# Patient Record
Sex: Female | Born: 1935 | Race: White | Hispanic: No | Smoking: Former smoker
Health system: Southern US, Community
[De-identification: ages and names within clinical notes are randomized; demographics above are authoritative.]

## PROBLEM LIST (undated history)

## (undated) DIAGNOSIS — H3530 Unspecified macular degeneration: Secondary | ICD-10-CM

## (undated) DIAGNOSIS — F17201 Nicotine dependence, unspecified, in remission: Secondary | ICD-10-CM

## (undated) DIAGNOSIS — F419 Anxiety disorder, unspecified: Secondary | ICD-10-CM

## (undated) DIAGNOSIS — F410 Panic disorder [episodic paroxysmal anxiety] without agoraphobia: Secondary | ICD-10-CM

## (undated) DIAGNOSIS — I4891 Unspecified atrial fibrillation: Secondary | ICD-10-CM

## (undated) DIAGNOSIS — C55 Malignant neoplasm of uterus, part unspecified: Secondary | ICD-10-CM

## (undated) DIAGNOSIS — K635 Polyp of colon: Secondary | ICD-10-CM

## (undated) DIAGNOSIS — F32A Depression, unspecified: Secondary | ICD-10-CM

## (undated) DIAGNOSIS — K922 Gastrointestinal hemorrhage, unspecified: Secondary | ICD-10-CM

## (undated) DIAGNOSIS — Z7901 Long term (current) use of anticoagulants: Secondary | ICD-10-CM

## (undated) DIAGNOSIS — K5790 Diverticulosis of intestine, part unspecified, without perforation or abscess without bleeding: Secondary | ICD-10-CM

## (undated) DIAGNOSIS — F329 Major depressive disorder, single episode, unspecified: Secondary | ICD-10-CM

## (undated) HISTORY — PX: COLONOSCOPY: SHX174

## (undated) HISTORY — DX: Unspecified macular degeneration: H35.30

## (undated) HISTORY — DX: Anxiety disorder, unspecified: F41.9

## (undated) HISTORY — DX: Depression, unspecified: F32.A

## (undated) HISTORY — DX: Nicotine dependence, unspecified, in remission: F17.201

## (undated) HISTORY — DX: Malignant neoplasm of uterus, part unspecified: C55

## (undated) HISTORY — DX: Diverticulosis of intestine, part unspecified, without perforation or abscess without bleeding: K57.90

## (undated) HISTORY — DX: Polyp of colon: K63.5

## (undated) HISTORY — DX: Unspecified atrial fibrillation: I48.91

## (undated) HISTORY — DX: Long term (current) use of anticoagulants: Z79.01

## (undated) HISTORY — DX: Major depressive disorder, single episode, unspecified: F32.9

## (undated) HISTORY — DX: Gastrointestinal hemorrhage, unspecified: K92.2

---

## 1989-03-10 HISTORY — PX: CHOLECYSTECTOMY: SHX55

## 2000-07-10 DIAGNOSIS — C55 Malignant neoplasm of uterus, part unspecified: Secondary | ICD-10-CM

## 2000-07-10 HISTORY — DX: Malignant neoplasm of uterus, part unspecified: C55

## 2000-07-10 HISTORY — PX: TOTAL ABDOMINAL HYSTERECTOMY W/ BILATERAL SALPINGOOPHORECTOMY: SHX83

## 2000-07-10 HISTORY — PX: APPENDECTOMY: SHX54

## 2000-08-01 ENCOUNTER — Encounter: Admission: RE | Admit: 2000-08-01 | Discharge: 2000-10-30 | Payer: Self-pay | Admitting: *Deleted

## 2003-07-11 DIAGNOSIS — K635 Polyp of colon: Secondary | ICD-10-CM

## 2003-07-11 HISTORY — DX: Polyp of colon: K63.5

## 2004-04-05 ENCOUNTER — Ambulatory Visit (HOSPITAL_COMMUNITY): Admission: RE | Admit: 2004-04-05 | Discharge: 2004-04-05 | Payer: Self-pay | Admitting: *Deleted

## 2004-11-30 ENCOUNTER — Encounter (INDEPENDENT_AMBULATORY_CARE_PROVIDER_SITE_OTHER): Payer: Self-pay | Admitting: Internal Medicine

## 2005-07-10 DIAGNOSIS — I4891 Unspecified atrial fibrillation: Secondary | ICD-10-CM

## 2005-07-10 HISTORY — DX: Unspecified atrial fibrillation: I48.91

## 2005-08-23 ENCOUNTER — Ambulatory Visit (HOSPITAL_COMMUNITY): Admission: RE | Admit: 2005-08-23 | Discharge: 2005-08-23 | Payer: Self-pay | Admitting: Internal Medicine

## 2005-08-23 ENCOUNTER — Ambulatory Visit: Payer: Self-pay | Admitting: Internal Medicine

## 2005-08-23 ENCOUNTER — Encounter (INDEPENDENT_AMBULATORY_CARE_PROVIDER_SITE_OTHER): Payer: Self-pay | Admitting: Internal Medicine

## 2005-08-29 ENCOUNTER — Ambulatory Visit: Payer: Self-pay | Admitting: *Deleted

## 2005-09-06 ENCOUNTER — Ambulatory Visit: Payer: Self-pay | Admitting: Cardiology

## 2005-09-06 ENCOUNTER — Encounter (INDEPENDENT_AMBULATORY_CARE_PROVIDER_SITE_OTHER): Payer: Self-pay | Admitting: Internal Medicine

## 2005-09-06 ENCOUNTER — Ambulatory Visit (HOSPITAL_COMMUNITY): Admission: RE | Admit: 2005-09-06 | Discharge: 2005-09-06 | Payer: Self-pay | Admitting: *Deleted

## 2005-09-07 ENCOUNTER — Encounter: Payer: Self-pay | Admitting: Cardiology

## 2005-09-07 LAB — CONVERTED CEMR LAB
ALT: 12 units/L
AST: 15 units/L
Albumin: 4.5 g/dL
Alkaline Phosphatase: 91 units/L
BUN: 12 mg/dL
CO2: 23 meq/L
Chloride: 106 meq/L
Glucose, Bld: 99 mg/dL
Total Protein: 7.4 g/dL
Triglyceride fasting, serum: 70 mg/dL

## 2005-09-08 ENCOUNTER — Ambulatory Visit: Payer: Self-pay | Admitting: *Deleted

## 2005-09-08 ENCOUNTER — Encounter (INDEPENDENT_AMBULATORY_CARE_PROVIDER_SITE_OTHER): Payer: Self-pay | Admitting: Internal Medicine

## 2005-09-12 ENCOUNTER — Ambulatory Visit: Payer: Self-pay | Admitting: *Deleted

## 2005-09-12 ENCOUNTER — Encounter (INDEPENDENT_AMBULATORY_CARE_PROVIDER_SITE_OTHER): Payer: Self-pay | Admitting: Internal Medicine

## 2005-09-18 ENCOUNTER — Ambulatory Visit: Payer: Self-pay | Admitting: Internal Medicine

## 2005-09-18 ENCOUNTER — Ambulatory Visit: Payer: Self-pay | Admitting: Cardiology

## 2005-09-20 ENCOUNTER — Encounter (INDEPENDENT_AMBULATORY_CARE_PROVIDER_SITE_OTHER): Payer: Self-pay | Admitting: Internal Medicine

## 2005-09-21 ENCOUNTER — Encounter: Payer: Self-pay | Admitting: Cardiology

## 2005-09-26 ENCOUNTER — Ambulatory Visit: Payer: Self-pay | Admitting: Cardiology

## 2005-10-04 ENCOUNTER — Telehealth (INDEPENDENT_AMBULATORY_CARE_PROVIDER_SITE_OTHER): Payer: Self-pay | Admitting: Internal Medicine

## 2005-10-24 ENCOUNTER — Ambulatory Visit: Payer: Self-pay | Admitting: *Deleted

## 2005-11-16 ENCOUNTER — Encounter (INDEPENDENT_AMBULATORY_CARE_PROVIDER_SITE_OTHER): Payer: Self-pay | Admitting: Internal Medicine

## 2005-11-17 ENCOUNTER — Ambulatory Visit: Payer: Self-pay | Admitting: Internal Medicine

## 2005-11-21 ENCOUNTER — Encounter (INDEPENDENT_AMBULATORY_CARE_PROVIDER_SITE_OTHER): Payer: Self-pay | Admitting: Internal Medicine

## 2005-11-24 ENCOUNTER — Ambulatory Visit: Payer: Self-pay | Admitting: *Deleted

## 2005-12-26 ENCOUNTER — Ambulatory Visit: Payer: Self-pay | Admitting: *Deleted

## 2006-01-23 ENCOUNTER — Ambulatory Visit: Payer: Self-pay | Admitting: Cardiology

## 2006-02-02 ENCOUNTER — Encounter (INDEPENDENT_AMBULATORY_CARE_PROVIDER_SITE_OTHER): Payer: Self-pay | Admitting: Internal Medicine

## 2006-03-05 ENCOUNTER — Ambulatory Visit: Payer: Self-pay | Admitting: Cardiology

## 2006-04-05 ENCOUNTER — Ambulatory Visit: Payer: Self-pay | Admitting: Cardiology

## 2006-04-12 ENCOUNTER — Ambulatory Visit: Payer: Self-pay | Admitting: Cardiology

## 2006-04-26 ENCOUNTER — Ambulatory Visit: Payer: Self-pay | Admitting: Cardiology

## 2006-05-17 ENCOUNTER — Ambulatory Visit: Payer: Self-pay | Admitting: *Deleted

## 2006-06-05 ENCOUNTER — Ambulatory Visit: Payer: Self-pay | Admitting: Cardiovascular Disease

## 2006-06-21 ENCOUNTER — Ambulatory Visit: Payer: Self-pay | Admitting: Internal Medicine

## 2006-07-19 ENCOUNTER — Ambulatory Visit: Payer: Self-pay | Admitting: Internal Medicine

## 2006-08-28 ENCOUNTER — Ambulatory Visit: Payer: Self-pay | Admitting: Cardiology

## 2006-09-27 ENCOUNTER — Ambulatory Visit: Payer: Self-pay | Admitting: Cardiology

## 2006-10-15 ENCOUNTER — Ambulatory Visit: Payer: Self-pay | Admitting: Internal Medicine

## 2006-10-22 ENCOUNTER — Ambulatory Visit (HOSPITAL_COMMUNITY): Admission: RE | Admit: 2006-10-22 | Discharge: 2006-10-22 | Payer: Self-pay | Admitting: Internal Medicine

## 2006-10-24 ENCOUNTER — Ambulatory Visit: Payer: Self-pay | Admitting: Cardiology

## 2006-11-13 ENCOUNTER — Ambulatory Visit: Payer: Self-pay | Admitting: Cardiology

## 2006-12-12 ENCOUNTER — Ambulatory Visit: Payer: Self-pay | Admitting: Cardiology

## 2007-01-16 ENCOUNTER — Ambulatory Visit: Payer: Self-pay | Admitting: Cardiology

## 2007-01-30 ENCOUNTER — Ambulatory Visit: Payer: Self-pay | Admitting: Cardiovascular Disease

## 2007-02-27 ENCOUNTER — Ambulatory Visit: Payer: Self-pay | Admitting: Cardiovascular Disease

## 2007-03-08 ENCOUNTER — Ambulatory Visit: Payer: Self-pay | Admitting: Cardiology

## 2007-03-22 ENCOUNTER — Ambulatory Visit: Payer: Self-pay | Admitting: Cardiology

## 2007-04-19 ENCOUNTER — Ambulatory Visit: Payer: Self-pay | Admitting: Internal Medicine

## 2007-05-13 ENCOUNTER — Ambulatory Visit: Payer: Self-pay | Admitting: Cardiology

## 2007-05-29 ENCOUNTER — Ambulatory Visit: Payer: Self-pay | Admitting: Internal Medicine

## 2007-05-29 DIAGNOSIS — H3530 Unspecified macular degeneration: Secondary | ICD-10-CM | POA: Insufficient documentation

## 2007-05-30 ENCOUNTER — Encounter (INDEPENDENT_AMBULATORY_CARE_PROVIDER_SITE_OTHER): Payer: Self-pay | Admitting: Internal Medicine

## 2007-06-07 ENCOUNTER — Emergency Department (HOSPITAL_COMMUNITY): Admission: EM | Admit: 2007-06-07 | Discharge: 2007-06-07 | Payer: Self-pay | Admitting: Emergency Medicine

## 2007-06-10 ENCOUNTER — Encounter: Payer: Self-pay | Admitting: Cardiology

## 2007-06-10 ENCOUNTER — Ambulatory Visit: Payer: Self-pay | Admitting: Internal Medicine

## 2007-06-10 LAB — CONVERTED CEMR LAB: HCT: 42.7 %

## 2007-06-13 ENCOUNTER — Telehealth (INDEPENDENT_AMBULATORY_CARE_PROVIDER_SITE_OTHER): Payer: Self-pay | Admitting: *Deleted

## 2007-06-14 ENCOUNTER — Ambulatory Visit: Payer: Self-pay | Admitting: Internal Medicine

## 2007-06-14 LAB — CONVERTED CEMR LAB
Hemoglobin: 11.6 g/dL
INR: 1.3
OCCULT 1: POSITIVE

## 2007-06-17 ENCOUNTER — Ambulatory Visit: Payer: Self-pay | Admitting: Internal Medicine

## 2007-06-17 LAB — CONVERTED CEMR LAB: Prothrombin Time: 11.9 s

## 2007-06-19 ENCOUNTER — Encounter: Payer: Self-pay | Admitting: Gastroenterology

## 2007-06-19 ENCOUNTER — Ambulatory Visit (HOSPITAL_COMMUNITY): Admission: RE | Admit: 2007-06-19 | Discharge: 2007-06-19 | Payer: Self-pay | Admitting: Gastroenterology

## 2007-06-19 ENCOUNTER — Ambulatory Visit: Payer: Self-pay | Admitting: Gastroenterology

## 2007-06-20 ENCOUNTER — Telehealth (INDEPENDENT_AMBULATORY_CARE_PROVIDER_SITE_OTHER): Payer: Self-pay | Admitting: Internal Medicine

## 2007-06-20 ENCOUNTER — Ambulatory Visit: Payer: Self-pay | Admitting: Internal Medicine

## 2007-06-26 ENCOUNTER — Ambulatory Visit: Payer: Self-pay | Admitting: Internal Medicine

## 2007-07-02 ENCOUNTER — Ambulatory Visit: Payer: Self-pay | Admitting: Cardiology

## 2007-07-09 ENCOUNTER — Ambulatory Visit: Payer: Self-pay | Admitting: Cardiology

## 2007-07-17 ENCOUNTER — Ambulatory Visit: Payer: Self-pay | Admitting: Cardiology

## 2007-08-02 ENCOUNTER — Ambulatory Visit: Payer: Self-pay | Admitting: Cardiology

## 2007-09-02 ENCOUNTER — Ambulatory Visit: Payer: Self-pay | Admitting: Cardiology

## 2007-09-30 ENCOUNTER — Ambulatory Visit: Payer: Self-pay | Admitting: Internal Medicine

## 2007-10-04 ENCOUNTER — Ambulatory Visit: Payer: Self-pay | Admitting: Cardiology

## 2007-10-11 ENCOUNTER — Ambulatory Visit: Payer: Self-pay | Admitting: Cardiology

## 2007-11-18 ENCOUNTER — Ambulatory Visit: Payer: Self-pay | Admitting: Cardiology

## 2007-12-03 ENCOUNTER — Ambulatory Visit: Payer: Self-pay | Admitting: Cardiology

## 2007-12-24 ENCOUNTER — Ambulatory Visit: Payer: Self-pay | Admitting: Cardiology

## 2008-01-21 ENCOUNTER — Ambulatory Visit: Payer: Self-pay | Admitting: Cardiology

## 2008-02-25 ENCOUNTER — Ambulatory Visit: Payer: Self-pay | Admitting: Cardiology

## 2008-03-09 ENCOUNTER — Ambulatory Visit: Payer: Self-pay | Admitting: Cardiology

## 2008-04-06 ENCOUNTER — Ambulatory Visit: Payer: Self-pay | Admitting: Cardiology

## 2008-04-27 ENCOUNTER — Ambulatory Visit: Payer: Self-pay | Admitting: Cardiology

## 2008-05-11 ENCOUNTER — Ambulatory Visit: Payer: Self-pay | Admitting: Cardiology

## 2008-06-01 ENCOUNTER — Ambulatory Visit: Payer: Self-pay | Admitting: Cardiology

## 2008-06-08 ENCOUNTER — Ambulatory Visit: Payer: Self-pay | Admitting: Cardiology

## 2008-07-06 ENCOUNTER — Ambulatory Visit: Payer: Self-pay | Admitting: Cardiology

## 2008-08-03 ENCOUNTER — Ambulatory Visit: Payer: Self-pay | Admitting: Cardiology

## 2008-08-31 ENCOUNTER — Ambulatory Visit: Payer: Self-pay | Admitting: Cardiology

## 2008-10-12 ENCOUNTER — Ambulatory Visit: Payer: Self-pay | Admitting: Cardiology

## 2008-11-19 ENCOUNTER — Ambulatory Visit: Payer: Self-pay | Admitting: Cardiology

## 2008-12-14 ENCOUNTER — Ambulatory Visit: Payer: Self-pay | Admitting: Cardiology

## 2008-12-21 ENCOUNTER — Ambulatory Visit: Payer: Self-pay | Admitting: Cardiology

## 2009-01-07 ENCOUNTER — Ambulatory Visit: Payer: Self-pay | Admitting: Cardiology

## 2009-02-08 ENCOUNTER — Ambulatory Visit: Payer: Self-pay | Admitting: Cardiology

## 2009-02-22 ENCOUNTER — Ambulatory Visit: Payer: Self-pay | Admitting: Cardiology

## 2009-02-22 ENCOUNTER — Encounter: Payer: Self-pay | Admitting: *Deleted

## 2009-03-18 ENCOUNTER — Ambulatory Visit: Payer: Self-pay

## 2009-03-18 LAB — CONVERTED CEMR LAB: POC INR: 1.8

## 2009-04-07 ENCOUNTER — Ambulatory Visit: Payer: Self-pay | Admitting: Cardiology

## 2009-04-07 LAB — CONVERTED CEMR LAB: POC INR: 4.6

## 2009-04-21 ENCOUNTER — Ambulatory Visit: Payer: Self-pay | Admitting: Cardiology

## 2009-04-21 LAB — CONVERTED CEMR LAB: POC INR: 2

## 2009-05-27 ENCOUNTER — Encounter (INDEPENDENT_AMBULATORY_CARE_PROVIDER_SITE_OTHER): Payer: Self-pay | Admitting: Cardiology

## 2009-05-31 ENCOUNTER — Ambulatory Visit: Payer: Self-pay | Admitting: Cardiology

## 2009-07-12 ENCOUNTER — Ambulatory Visit: Payer: Self-pay | Admitting: Cardiology

## 2009-07-28 ENCOUNTER — Encounter (INDEPENDENT_AMBULATORY_CARE_PROVIDER_SITE_OTHER): Payer: Self-pay | Admitting: Cardiology

## 2009-08-11 ENCOUNTER — Telehealth: Payer: Self-pay | Admitting: Cardiology

## 2009-08-12 ENCOUNTER — Telehealth (INDEPENDENT_AMBULATORY_CARE_PROVIDER_SITE_OTHER): Payer: Self-pay | Admitting: *Deleted

## 2009-08-18 ENCOUNTER — Ambulatory Visit: Payer: Self-pay | Admitting: Cardiology

## 2009-08-18 LAB — CONVERTED CEMR LAB: POC INR: 1

## 2009-08-25 ENCOUNTER — Ambulatory Visit: Payer: Self-pay | Admitting: Cardiovascular Disease

## 2009-08-25 LAB — CONVERTED CEMR LAB: POC INR: 1.6

## 2009-09-01 ENCOUNTER — Ambulatory Visit: Payer: Self-pay | Admitting: Cardiology

## 2009-09-01 LAB — CONVERTED CEMR LAB: POC INR: 2.7

## 2009-09-15 ENCOUNTER — Encounter (INDEPENDENT_AMBULATORY_CARE_PROVIDER_SITE_OTHER): Payer: Self-pay | Admitting: *Deleted

## 2009-09-20 ENCOUNTER — Ambulatory Visit: Payer: Self-pay | Admitting: Cardiology

## 2009-09-20 LAB — CONVERTED CEMR LAB: POC INR: 2.7

## 2009-09-29 ENCOUNTER — Telehealth: Payer: Self-pay | Admitting: Cardiology

## 2009-10-13 ENCOUNTER — Ambulatory Visit: Payer: Self-pay | Admitting: Cardiology

## 2009-10-13 LAB — CONVERTED CEMR LAB: POC INR: 3

## 2009-11-04 ENCOUNTER — Ambulatory Visit: Payer: Self-pay | Admitting: Cardiology

## 2009-12-02 ENCOUNTER — Ambulatory Visit: Payer: Self-pay | Admitting: Cardiology

## 2009-12-02 LAB — CONVERTED CEMR LAB: POC INR: 1.4

## 2009-12-13 ENCOUNTER — Ambulatory Visit: Payer: Self-pay | Admitting: Cardiology

## 2010-01-03 ENCOUNTER — Ambulatory Visit: Payer: Self-pay | Admitting: Cardiology

## 2010-01-03 LAB — CONVERTED CEMR LAB: POC INR: 2.9

## 2010-01-31 ENCOUNTER — Ambulatory Visit: Payer: Self-pay | Admitting: Cardiology

## 2010-02-21 ENCOUNTER — Ambulatory Visit: Payer: Self-pay | Admitting: Cardiology

## 2010-02-21 LAB — CONVERTED CEMR LAB: POC INR: 2.8

## 2010-03-01 ENCOUNTER — Ambulatory Visit: Payer: Self-pay | Admitting: Cardiology

## 2010-03-01 ENCOUNTER — Encounter: Payer: Self-pay | Admitting: Adult Health

## 2010-03-09 ENCOUNTER — Telehealth (INDEPENDENT_AMBULATORY_CARE_PROVIDER_SITE_OTHER): Payer: Self-pay | Admitting: *Deleted

## 2010-03-10 ENCOUNTER — Ambulatory Visit: Payer: Self-pay | Admitting: Cardiovascular Disease

## 2010-03-10 ENCOUNTER — Ambulatory Visit (HOSPITAL_COMMUNITY): Admission: RE | Admit: 2010-03-10 | Discharge: 2010-03-10 | Payer: Self-pay | Admitting: Cardiology

## 2010-03-11 ENCOUNTER — Ambulatory Visit: Payer: Self-pay | Admitting: Cardiology

## 2010-03-11 DIAGNOSIS — F068 Other specified mental disorders due to known physiological condition: Secondary | ICD-10-CM

## 2010-03-11 LAB — CONVERTED CEMR LAB
ALT: 12 units/L (ref 0–35)
Alkaline Phosphatase: 74 units/L (ref 39–117)
BUN: 15 mg/dL (ref 6–23)
Cholesterol: 161 mg/dL (ref 0–200)
Eosinophils Absolute: 0.1 10*3/uL (ref 0.0–0.7)
Eosinophils Relative: 2 % (ref 0–5)
Glucose, Bld: 90 mg/dL (ref 70–99)
HDL: 67 mg/dL (ref >39)
Hemoglobin: 15.6 g/dL — ABNORMAL HIGH (ref 12.0–15.0)
LDL Cholesterol: 80 mg/dL (ref 0–99)
Lymphocytes Relative: 22 % (ref 12–46)
Lymphs Abs: 1.2 10*3/uL (ref 0.7–4.0)
MCV: 91.1 fL (ref 78.0–100.0)
Monocytes Absolute: 0.4 10*3/uL (ref 0.1–1.0)
Neutrophils Relative %: 69 % (ref 43–77)
Platelets: 268 10*3/uL (ref 150–400)
RBC: 5.16 M/uL — ABNORMAL HIGH (ref 3.87–5.11)
RDW: 13.8 % (ref 11.5–15.5)
Total Bilirubin: 1.3 mg/dL — ABNORMAL HIGH (ref 0.3–1.2)
Total Protein: 6.6 g/dL (ref 6.0–8.3)
WBC: 5.4 10*3/uL (ref 4.0–10.5)

## 2010-03-15 ENCOUNTER — Encounter: Payer: Self-pay | Admitting: Cardiology

## 2010-03-16 ENCOUNTER — Ambulatory Visit: Payer: Self-pay | Admitting: Cardiology

## 2010-03-16 LAB — CONVERTED CEMR LAB: POC INR: 1.3

## 2010-03-17 ENCOUNTER — Encounter (INDEPENDENT_AMBULATORY_CARE_PROVIDER_SITE_OTHER): Payer: Self-pay | Admitting: *Deleted

## 2010-03-17 LAB — CONVERTED CEMR LAB
OCCULT 1: NEGATIVE
OCCULT 2: NEGATIVE

## 2010-03-18 ENCOUNTER — Ambulatory Visit: Payer: Self-pay | Admitting: Cardiology

## 2010-03-18 ENCOUNTER — Encounter (INDEPENDENT_AMBULATORY_CARE_PROVIDER_SITE_OTHER): Payer: Self-pay | Admitting: *Deleted

## 2010-03-18 ENCOUNTER — Encounter (INDEPENDENT_AMBULATORY_CARE_PROVIDER_SITE_OTHER): Payer: Self-pay

## 2010-03-24 ENCOUNTER — Ambulatory Visit: Payer: Self-pay | Admitting: Cardiology

## 2010-03-24 LAB — CONVERTED CEMR LAB: POC INR: 1.3

## 2010-03-31 ENCOUNTER — Ambulatory Visit: Payer: Self-pay | Admitting: Cardiology

## 2010-03-31 ENCOUNTER — Encounter (INDEPENDENT_AMBULATORY_CARE_PROVIDER_SITE_OTHER): Payer: Self-pay | Admitting: *Deleted

## 2010-03-31 LAB — CONVERTED CEMR LAB: POC INR: 4.7

## 2010-04-07 ENCOUNTER — Ambulatory Visit: Payer: Self-pay | Admitting: Cardiology

## 2010-04-14 ENCOUNTER — Ambulatory Visit: Payer: Self-pay | Admitting: Cardiology

## 2010-04-21 ENCOUNTER — Ambulatory Visit: Payer: Self-pay | Admitting: Cardiology

## 2010-04-27 ENCOUNTER — Ambulatory Visit: Payer: Self-pay | Admitting: Cardiology

## 2010-04-27 LAB — CONVERTED CEMR LAB: POC INR: 3

## 2010-05-04 ENCOUNTER — Ambulatory Visit: Payer: Self-pay | Admitting: Cardiology

## 2010-05-19 ENCOUNTER — Ambulatory Visit: Payer: Self-pay | Admitting: Cardiology

## 2010-05-19 LAB — CONVERTED CEMR LAB: POC INR: 2.2

## 2010-06-09 ENCOUNTER — Ambulatory Visit: Payer: Self-pay | Admitting: Cardiology

## 2010-07-07 ENCOUNTER — Ambulatory Visit: Payer: Self-pay | Admitting: Cardiology

## 2010-07-07 LAB — CONVERTED CEMR LAB: POC INR: 3.2

## 2010-07-31 ENCOUNTER — Encounter: Payer: Self-pay | Admitting: Internal Medicine

## 2010-07-31 ENCOUNTER — Encounter: Payer: Self-pay | Admitting: Cardiology

## 2010-08-04 ENCOUNTER — Ambulatory Visit: Admission: RE | Admit: 2010-08-04 | Discharge: 2010-08-04 | Payer: Self-pay | Source: Home / Self Care

## 2010-08-04 LAB — CONVERTED CEMR LAB: POC INR: 4.6

## 2010-08-11 NOTE — Medication Information (Signed)
Summary: ccr-lr  Anticoagulant Therapy  Managed by: Vashti Hey, RN Supervising MD: Diona Browner MD, Remi Deter Indication 1: Atrial Fibrillation Lab Used: [facility] [place] Site: [place] INR POC 1.4  Dietary changes: no    Health status changes: no    Bleeding/hemorrhagic complications: no    Recent/future hospitalizations: no    Any changes in medication regimen? no    Recent/future dental: no  Any missed doses?: yes     Details: not sure if she missed or not  Is patient compliant with meds? yes       Allergies: No Known Drug Allergies  Anticoagulation Management History:      The patient is taking warfarin and comes in today for a routine follow up visit.  Positive risk factors for bleeding include an age of 75 years or older and history of GI bleeding.  The bleeding index is 'intermediate risk'.  Positive CHADS2 values include Age > 64 years old.  The start date was 09/12/2005.  Her last INR was .9.  Anticoagulation responsible provider: Diona Browner MD, Remi Deter.  INR POC: 1.4.  Cuvette Lot#: [id number].  Exp: 10/11.    Anticoagulation Management Assessment/Plan:      The patient's current anticoagulation dose is Warfarin sodium 2.5 mg tabs: Take as directed by anticoagulation clinic.  The target INR is 2.0-3.0.  The next INR is due 12/13/2009.  Anticoagulation instructions were given to patient.  Results were reviewed/authorized by Vashti Hey, RN.  She was notified by Vashti Hey RN.         Prior Anticoagulation Instructions: INR 2.9 Continue coumadin 2.5mg  once daily except 3.75mg  on Fridays Pt continues to use pill box.  Current Anticoagulation Instructions: INR 1.4 Take coumadin 2 tablets tonight and tomorrow night then resume 1 tablet once daily except 1 1/2 tablets on Fridays

## 2010-08-11 NOTE — Medication Information (Signed)
Summary: ccr-lr  Anticoagulant Therapy  Managed by: Vashti Hey, RN PCP: none Supervising MD: Dietrich Pates MD, Molly Maduro Indication 1: Atrial Fibrillation Lab Used: [facility] [place] Site: [place] INR POC 2.6  Dietary changes: no    Health status changes: no    Bleeding/hemorrhagic complications: no    Recent/future hospitalizations: no    Any changes in medication regimen? no    Recent/future dental: no  Any missed doses?: no       Is patient compliant with meds? yes       Allergies: No Known Drug Allergies  Anticoagulation Management History:      The patient is taking warfarin and comes in today for a routine follow up visit.  Positive risk factors for bleeding include an age of 73 years or older and history of GI bleeding.  The bleeding index is 'intermediate risk'.  Positive CHADS2 values include Age > 34 years old.  The start date was 09/12/2005.  Her last INR was .9.  Anticoagulation responsible provider: Dietrich Pates MD, Molly Maduro.  INR POC: 2.6.  Cuvette Lot#: [id number].  Exp: 10/11.    Anticoagulation Management Assessment/Plan:      The patient's current anticoagulation dose is Warfarin sodium 2.5 mg tabs: Take as directed by anticoagulation clinic.  The target INR is 2.0-3.0.  The next INR is due 04/21/2010.  Anticoagulation instructions were given to patient.  Results were reviewed/authorized by Vashti Hey, RN.  She was notified by Vashti Hey RN.         Prior Anticoagulation Instructions: INR 2.9 Today is Thursday. Take 1 tablet every night until you come back next Thursday 04/14/10  Current Anticoagulation Instructions: INR 2.6 Continue coumadin 1 green tablet until you come back next Thursday

## 2010-08-11 NOTE — Medication Information (Signed)
Summary: PROTIME/TG  Anticoagulant Therapy  Managed by: Vashti Hey, RN Supervising MD: Dietrich Pates MD, Molly Maduro Indication 1: Atrial Fibrillation Lab Used: [facility] [place] Site: [place] INR POC 2.7  Dietary changes: no    Health status changes: no    Bleeding/hemorrhagic complications: no    Recent/future hospitalizations: no    Any changes in medication regimen? no    Recent/future dental: no  Any missed doses?: no       Is patient compliant with meds? yes       Allergies: No Known Drug Allergies  Anticoagulation Management History:      The patient is taking warfarin and comes in today for a routine follow up visit.  Positive risk factors for bleeding include an age of 75 years or older and history of GI bleeding.  The bleeding index is 'intermediate risk'.  Positive CHADS2 values include Age > 75 years old.  The start date was 09/12/2005.  Her last INR was .9.  Anticoagulation responsible provider: Dietrich Pates MD, Molly Maduro.  INR POC: 2.7.  Cuvette Lot#: [id number].  Exp: 10/11.    Anticoagulation Management Assessment/Plan:      The patient's current anticoagulation dose is Warfarin sodium 2.5 mg tabs: take one half tablet daily as directed by anticoagulation clinic.  The target INR is 2.0-3.0.  The next INR is due 10/06/2009.  Anticoagulation instructions were given to patient.  Results were reviewed/authorized by Vashti Hey, RN.  She was notified by Vashti Hey RN.         Prior Anticoagulation Instructions: INR 2.7 Continue coumadin 1 tablet once daily except 1 1/2 tablets on Fridays  Current Anticoagulation Instructions: INR 2.7 Continue coumadin 2.5mg  once daily except 3.75mg  on Fridays

## 2010-08-11 NOTE — Medication Information (Signed)
Summary: ccr-lr  Anticoagulant Therapy  Managed by: Vashti Hey, RN PCP: none Supervising MD: Dietrich Pates MD, Molly Maduro Indication 1: Atrial Fibrillation Lab Used: [facility] [place] Site: [place] INR POC 4.0  Dietary changes: no    Health status changes: no    Bleeding/hemorrhagic complications: no    Recent/future hospitalizations: no    Any changes in medication regimen? no    Recent/future dental: no  Any missed doses?: no       Is patient compliant with meds? yes       Allergies: No Known Drug Allergies  Anticoagulation Management History:      The patient is taking warfarin and comes in today for a routine follow up visit.  Positive risk factors for bleeding include an age of 75 years or older and history of GI bleeding.  The bleeding index is 'intermediate risk'.  Positive CHADS2 values include Age > 75 years old.  The start date was 09/12/2005.  Her last INR was .9.  Anticoagulation responsible provider: Dietrich Pates MD, Molly Maduro.  INR POC: 4.0.  Cuvette Lot#: [id number].  Exp: 10/11.    Anticoagulation Management Assessment/Plan:      The patient's current anticoagulation dose is Warfarin sodium 2.5 mg tabs: Take as directed by anticoagulation clinic.  The target INR is 2.0-3.0.  The next INR is due 04/27/2010.  Anticoagulation instructions were given to patient.  Results were reviewed/authorized by Vashti Hey, RN.  She was notified by Vashti Hey RN.         Prior Anticoagulation Instructions: INR 2.6 Continue coumadin 1 green tablet until you come back next Thursday  Current Anticoagulation Instructions: INR 4.0 Hold coumadin tonight, take 1/2 tablet tomorrow night then resume 1 tablet once daily

## 2010-08-11 NOTE — Medication Information (Signed)
Summary: ccr-lr  Anticoagulant Therapy  Managed by: Vashti Hey, RN PCP: none Supervising MD: Dietrich Pates MD, Molly Maduro Indication 1: Atrial Fibrillation Lab Used: [facility]  Site: [place] INR POC 1.3  Dietary changes: no    Health status changes: no    Bleeding/hemorrhagic complications: no    Recent/future hospitalizations: no    Any changes in medication regimen? no    Recent/future dental: no  Any missed doses?: yes     Details: Forgot some doses she thinks  Is patient compliant with meds? no     Details: Is becoming more forgetful.  Told pt pt bring pill box and meds next OV and I would prefill for her.   Allergies: No Known Drug Allergies  Anticoagulation Management History:      The patient is taking warfarin and comes in today for a routine follow up visit.  Positive risk factors for bleeding include an age of 70 years or older and history of GI bleeding.  The bleeding index is 'intermediate risk'.  Positive CHADS2 values include Age > 57 years old.  The start date was 09/12/2005.  Her last INR was .9.  Anticoagulation responsible provider: Dietrich Pates MD, Molly Maduro.  INR POC: 1.3.  Cuvette Lot#: [id number].  Exp: 10/11.    Anticoagulation Management Assessment/Plan:      The patient's current anticoagulation dose is Warfarin sodium 2.5 mg tabs: Take as directed by anticoagulation clinic.  The target INR is 2.0-3.0.  The next INR is due 03/24/2010.  Anticoagulation instructions were given to patient.  Results were reviewed/authorized by Vashti Hey, RN.  She was notified by Vashti Hey RN.         Prior Anticoagulation Instructions: INR 2.8 Continue coumadin 2.5mg  once daily except 3.75mg  on Fridays  Current Anticoagulation Instructions: INR 1.3 Take coumadin 2 tablets tonight, 1 1/2 tablets tomorrow night then resume 1 tablet once daily except 1 1/2 tablets on Fridays Pt is becoming more forgetful.  Sister accompanied pt today.

## 2010-08-11 NOTE — Medication Information (Signed)
Summary: protime/tg  Anticoagulant Therapy  Managed by: Vashti Hey, RN Supervising MD: Diona Browner MD, Remi Deter Indication 1: Atrial Fibrillation Lab Used: [facility] [place] Site: [place] INR POC 1.1  Dietary changes: no    Health status changes: no    Bleeding/hemorrhagic complications: no    Recent/future hospitalizations: no    Any changes in medication regimen? no    Recent/future dental: no  Any missed doses?: yes     Details: pt denies missing dose but does get forgetful    Recommended using pill box  Is patient compliant with meds? yes       Allergies: No Known Drug Allergies  Anticoagulation Management History:      The patient is taking warfarin and comes in today for a routine follow up visit.  Positive risk factors for bleeding include an age of 40 years or older and history of GI bleeding.  The bleeding index is 'intermediate risk'.  Positive CHADS2 values include Age > 28 years old.  The start date was 09/12/2005.  Her last INR was .9.  Anticoagulation responsible provider: Diona Browner MD, Remi Deter.  INR POC: 1.1.  Cuvette Lot#: [id number].  Exp: 10/11.    Anticoagulation Management Assessment/Plan:      The patient's current anticoagulation dose is Warfarin sodium 5 mg  tabs: 1/2 by mouth once daily.  The target INR is 2.0-3.0.  The next INR is due 07/21/2009.  Anticoagulation instructions were given to patient.  Results were reviewed/authorized by Vashti Hey, RN.  She was notified by Vashti Hey RN.         Prior Anticoagulation Instructions: INR 3.4 Hold coumadin tonight then resume 2.5mg  once daily except 3.75mg  on Fridays  Current Anticoagulation Instructions: INR 1.1 Take coumadin 2 tablets tonight and tomorrow night then resume 1 tablet once daily except 1 1/2 tablets on Fridays

## 2010-08-11 NOTE — Medication Information (Signed)
Summary: ccr-lr  Anticoagulant Therapy  Managed by: Vashti Hey, RN PCP: none Supervising MD: Dietrich Pates MD, Molly Maduro Indication 1: Atrial Fibrillation Lab Used: [facility] [place] Site: [place] INR POC 1.3  Dietary changes: no    Health status changes: yes       Details: increasing signs of dementia  Bleeding/hemorrhagic complications: no    Recent/future hospitalizations: no    Any changes in medication regimen? no    Recent/future dental: no  Any missed doses?: yes     Details: pt not sure  Is patient compliant with meds? yes       Allergies: No Known Drug Allergies  Anticoagulation Management History:      The patient is taking warfarin and comes in today for a routine follow up visit.  Positive risk factors for bleeding include an age of 75 years or older and history of GI bleeding.  The bleeding index is 'intermediate risk'.  Positive CHADS2 values include Age > 26 years old.  The start date was 09/12/2005.  Her last INR was .9.  Anticoagulation responsible provider: Dietrich Pates MD, Molly Maduro.  INR POC: 1.3.  Cuvette Lot#: [id number].  Exp: 10/11.    Anticoagulation Management Assessment/Plan:      The patient's current anticoagulation dose is Warfarin sodium 2.5 mg tabs: Take as directed by anticoagulation clinic.  The target INR is 2.0-3.0.  The next INR is due 03/31/2010.  Anticoagulation instructions were given to patient.  Results were reviewed/authorized by Vashti Hey, RN.  She was notified by Vashti Hey RN.         Prior Anticoagulation Instructions: INR 1.3 Take coumadin 2 tablets tonight, 1 1/2 tablets tomorrow night then resume 1 tablet once daily except 1 1/2 tablets on Fridays Pt is becoming more forgetful.  Sister accompanied pt today.   Current Anticoagulation Instructions: INR 1.3 Take coumadin 2 tablets tonight and tomorrow night then resume 1 tablet once daily except 1 1/2 tablets on Fridays Pill Box filled for pt.

## 2010-08-11 NOTE — Medication Information (Signed)
Summary: ccr-lr  Anticoagulant Therapy  Managed by: Vashti Hey, RN Supervising MD: Daleen Squibb MD, Maisie Fus Indication 1: Atrial Fibrillation Lab Used: [facility] [place] Site: [place] INR POC 2.8  Dietary changes: no    Health status changes: no    Bleeding/hemorrhagic complications: no    Recent/future hospitalizations: no    Any changes in medication regimen? no    Recent/future dental: no  Any missed doses?: no       Is patient compliant with meds? yes       Allergies: No Known Drug Allergies  Anticoagulation Management History:      The patient is taking warfarin and comes in today for a routine follow up visit.  Positive risk factors for bleeding include an age of 50 years or older and history of GI bleeding.  The bleeding index is 'intermediate risk'.  Positive CHADS2 values include Age > 33 years old.  The start date was 09/12/2005.  Her last INR was .9.  Anticoagulation responsible provider: Daleen Squibb MD, Maisie Fus.  INR POC: 2.8.  Cuvette Lot#: [id number].  Exp: 10/11.    Anticoagulation Management Assessment/Plan:      The patient's current anticoagulation dose is Warfarin sodium 2.5 mg tabs: Take as directed by anticoagulation clinic.  The target INR is 2.0-3.0.  The next INR is due 03/16/2010.  Anticoagulation instructions were given to patient.  Results were reviewed/authorized by Vashti Hey, RN.  She was notified by Vashti Hey RN.         Prior Anticoagulation Instructions: INR 1.8 Take coumadin 2 tablets tonight then resume 1 tablet once daily except 1 1/2 tablets on Fridays  Current Anticoagulation Instructions: INR 2.8 Continue coumadin 2.5mg  once daily except 3.75mg  on Fridays

## 2010-08-11 NOTE — Medication Information (Signed)
Summary: ccr-lr  Anticoagulant Therapy  Managed by: Vashti Hey, RN Supervising MD: Eden Emms MD, Theron Arista Indication 1: Atrial Fibrillation Lab Used: [facility] [place] Site: [place] INR POC 1.6  Dietary changes: no    Health status changes: no    Bleeding/hemorrhagic complications: no    Recent/future hospitalizations: no    Any changes in medication regimen? no    Recent/future dental: no  Any missed doses?: no       Is patient compliant with meds? yes      Comments: pt is using pill box for coumadin  Allergies: No Known Drug Allergies  Anticoagulation Management History:      The patient is taking warfarin and comes in today for a routine follow up visit.  Positive risk factors for bleeding include an age of 75 years or older and history of GI bleeding.  The bleeding index is 'intermediate risk'.  Positive CHADS2 values include Age > 75 years old.  The start date was 09/12/2005.  Her last INR was .9.  Anticoagulation responsible provider: Eden Emms MD, Theron Arista.  INR POC: 1.6.  Cuvette Lot#: [id number].  Exp: 10/11.    Anticoagulation Management Assessment/Plan:      The patient's current anticoagulation dose is Warfarin sodium 2.5 mg tabs: take one half tablet daily as directed by anticoagulation clinic.  The target INR is 2.0-3.0.  The next INR is due 09/01/2009.  Anticoagulation instructions were given to patient.  Results were reviewed/authorized by Vashti Hey, RN.  She was notified by Vashti Hey RN.         Prior Anticoagulation Instructions: INR 1.0 Take coumadin 1 tablet once daily except 1 1/2 tablets on Fridays  pt states she has been taking coumadin but she is very forgetful.  Pt does not have children close by.  Has 2 sisters in area.   Have recommended pt use a pill box for taking coumadin.  Gave pt pill box today and prefilled it for pt.  Will see her back in 1 week to monitor status.  If pt not taking correctly, will discuss with MD.     Current Anticoagulation Instructions: INR 1.6 Continue coumadin 2.5mg  once daily except 3.75mg  on Fridays Pt is using pill box for coumadin now.  Pill box prefilled for pt todays Recheck in 1 week

## 2010-08-11 NOTE — Letter (Signed)
Summary: [facility] - Delinquent Coumadin 1  [facility]  [address] [place] [address]   Phone: [phone]  Fax: [phone]     July 28, 2009 MRN: [id number]   [name] [address] [place], [place]  [address]   Dear Ms. Brockel,  This letter is being sent to you as a reminder that it is necessary for you to get your INR/PT checked regularly so that we can optimize your care.  Our records indicate that you were scheduled to have a test done recently.  As of today, we have not received the results of this test.  It is very important that you have your INR checked.  Please call our office at the number listed above to schedule an appointment at your earliest convenience.    If you have recently had your protime checked or have discontinued this medication, please contact our office at the above phone number to clarify this issue.  Thank you for this prompt attention to this important health care matter.  Sincerely, Vashti Hey RN  [facility] Team

## 2010-08-11 NOTE — Medication Information (Signed)
Summary: ccr-lr  Anticoagulant Therapy  Managed by: Vashti Hey, RN PCP: none Supervising MD: Diona Browner MD, Remi Deter Indication 1: Atrial Fibrillation Lab Used: [facility] [place] Site: [place] INR POC 4.7  Dietary changes: no    Health status changes: no    Bleeding/hemorrhagic complications: no    Recent/future hospitalizations: no    Any changes in medication regimen? no    Recent/future dental: no  Any missed doses?: no       Is patient compliant with meds? no     Details: forgetful   Sister is helping pt with reminders on meds   Allergies: No Known Drug Allergies  Anticoagulation Management History:      The patient is taking warfarin and comes in today for a routine follow up visit.  Positive risk factors for bleeding include an age of 75 years or older and history of GI bleeding.  The bleeding index is 'intermediate risk'.  Positive CHADS2 values include Age > 71 years old.  The start date was 09/12/2005.  Her last INR was .9.  Anticoagulation responsible [place]: Diona Browner MD, Remi Deter.  INR POC: 4.7.  Cuvette Lot#: [id number].  Exp: 10/11.    Anticoagulation Management Assessment/Plan:      The patient's current anticoagulation dose is Warfarin sodium 2.5 mg tabs: Take as directed by anticoagulation clinic.  The target INR is 2.0-3.0.  The next INR is due 04/07/2010.  Anticoagulation instructions were given to patient.  Results were reviewed/authorized by Vashti Hey, RN.  She was notified by Vashti Hey RN.         Prior Anticoagulation Instructions: INR 1.3 Take coumadin 2 tablets tonight and tomorrow night then resume 1 tablet once daily except 1 1/2 tablets on Fridays Pill Box filled for pt.  Current Anticoagulation Instructions: INR 4.7 Hold coumadin tonight, Take 1 tablet tomorrow night then take 1 tablet every night till I see you back on Thursday 04/07/10.  (Regular dose is 2.5mg  once daily except 3.75mg  on Fridays)

## 2010-08-11 NOTE — Medication Information (Signed)
Summary: ccr-lr  Anticoagulant Therapy  Managed by: Vashti Hey, RN Supervising MD: Dietrich Pates MD, Molly Maduro Indication 1: Atrial Fibrillation Lab Used: [facility] [place] Site: [place] INR POC 2.9  Dietary changes: no    Health status changes: no    Bleeding/hemorrhagic complications: no    Recent/future hospitalizations: no    Any changes in medication regimen? no    Recent/future dental: no  Any missed doses?: no       Is patient compliant with meds? yes       Allergies: No Known Drug Allergies  Anticoagulation Management History:      The patient is taking warfarin and comes in today for a routine follow up visit.  Positive risk factors for bleeding include an age of 75 years or older and history of GI bleeding.  The bleeding index is 'intermediate risk'.  Positive CHADS2 values include Age > 23 years old.  The start date was 09/12/2005.  Her last INR was .9.  Anticoagulation responsible provider: Dietrich Pates MD, Molly Maduro.  INR POC: 2.9.  Cuvette Lot#: [id number].  Exp: 10/11.    Anticoagulation Management Assessment/Plan:      The patient's current anticoagulation dose is Warfarin sodium 2.5 mg tabs: Take as directed by anticoagulation clinic.  The target INR is 2.0-3.0.  The next INR is due 01/31/2010.  Anticoagulation instructions were given to patient.  Results were reviewed/authorized by Vashti Hey, RN.  She was notified by Vashti Hey RN.         Prior Anticoagulation Instructions: INR 2.4 Continue coumadin 2.5mg  once daily except 3.75mg  on Fridays  Current Anticoagulation Instructions: INR 2.9 Continue coumadin 2.5mg  once daily except 3.75mg  on Fridays

## 2010-08-11 NOTE — Medication Information (Signed)
Summary: ccr-lr  Anticoagulant Therapy  Managed by: Vashti Hey, RN PCP: none Supervising MD: Diona Browner MD, Remi Deter Indication 1: Atrial Fibrillation Lab Used: [facility] [place] Site: [place] INR POC 3.0  Dietary changes: no    Health status changes: no    Bleeding/hemorrhagic complications: no    Recent/future hospitalizations: no    Any changes in medication regimen? no    Recent/future dental: no  Any missed doses?: no       Is patient compliant with meds? yes       Allergies: No Known Drug Allergies  Anticoagulation Management History:      The patient is taking warfarin and comes in today for a routine follow up visit.  Positive risk factors for bleeding include an age of 75 years or older and history of GI bleeding.  The bleeding index is 'intermediate risk'.  Positive CHADS2 values include Age > 20 years old.  The start date was 09/12/2005.  Her last INR was .9.  Anticoagulation responsible provider: Diona Browner MD, Remi Deter.  INR POC: 3.0.  Cuvette Lot#: [id number].  Exp: 10/11.    Anticoagulation Management Assessment/Plan:      The patient's current anticoagulation dose is Warfarin sodium 2.5 mg tabs: Take as directed by anticoagulation clinic.  The target INR is 2.0-3.0.  The next INR is due 05/04/2010.  Anticoagulation instructions were given to patient.  Results were reviewed/authorized by Vashti Hey, RN.  She was notified by Vashti Hey RN.         Prior Anticoagulation Instructions: INR 4.0 Hold coumadin tonight, take 1/2 tablet tomorrow night then resume 1 tablet once daily   Current Anticoagulation Instructions: INR 3.0 Continue coumadin 2.5mg  once daily

## 2010-08-11 NOTE — Medication Information (Signed)
Summary: ccr-lr  Anticoagulant Therapy  Managed by: Vashti Hey, RN Supervising MD: Dietrich Pates MD, Molly Maduro Indication 1: Atrial Fibrillation Lab Used: [facility] [place] Site: [place] INR POC 2.9  Dietary changes: no    Health status changes: no    Bleeding/hemorrhagic complications: no    Recent/future hospitalizations: no    Any changes in medication regimen? no    Recent/future dental: no  Any missed doses?: no       Is patient compliant with meds? yes       Allergies: No Known Drug Allergies  Anticoagulation Management History:      The patient is taking warfarin and comes in today for a routine follow up visit.  Positive risk factors for bleeding include an age of 70 years or older and history of GI bleeding.  The bleeding index is 'intermediate risk'.  Positive CHADS2 values include Age > 59 years old.  The start date was 09/12/2005.  Her last INR was .9.  Anticoagulation responsible provider: Dietrich Pates MD, Molly Maduro.  INR POC: 2.9.  Cuvette Lot#: [id number].  Exp: 10/11.    Anticoagulation Management Assessment/Plan:      The patient's current anticoagulation dose is Warfarin sodium 2.5 mg tabs: Take as directed by anticoagulation clinic.  The target INR is 2.0-3.0.  The next INR is due 12/02/2009.  Anticoagulation instructions were given to patient.  Results were reviewed/authorized by Vashti Hey, RN.  She was notified by Vashti Hey RN.         Prior Anticoagulation Instructions: INR 3.0 Continue coumadin 5mg  once daily except 7.5mg  on Fridays   Current Anticoagulation Instructions: INR 2.9 Continue coumadin 2.5mg  once daily except 3.75mg  on Fridays Pt continues to use pill box.

## 2010-08-11 NOTE — Medication Information (Signed)
Summary: ccr-lr  Anticoagulant Therapy  Managed by: Vashti Hey, RN PCP: none Supervising MD: Diona Browner MD, Remi Deter Indication 1: Atrial Fibrillation Lab Used: [facility] [place] Site: [place] INR POC 2.2  Dietary changes: no    Health status changes: no    Bleeding/hemorrhagic complications: no    Recent/future hospitalizations: no    Any changes in medication regimen? no    Recent/future dental: no  Any missed doses?: yes     Details: Missed 1 dose last Thursday  Is patient compliant with meds? no       Allergies: No Known Drug Allergies  Anticoagulation Management History:      The patient is taking warfarin and comes in today for a routine follow up visit.  Positive risk factors for bleeding include an age of 75 years or older and history of GI bleeding.  The bleeding index is 'intermediate risk'.  Positive CHADS2 values include Age > 75 years old.  The start date was 09/12/2005.  Her last INR was .9.  Anticoagulation responsible provider: Diona Browner MD, Remi Deter.  INR POC: 2.2.  Cuvette Lot#: [id number].  Exp: 10/11.    Anticoagulation Management Assessment/Plan:      The patient's current anticoagulation dose is Warfarin sodium 2.5 mg tabs: Take as directed by anticoagulation clinic.  The target INR is 2.0-3.0.  The next INR is due 06/09/2010.  Anticoagulation instructions were given to patient.  Results were reviewed/authorized by Vashti Hey, RN.  She was notified by Vashti Hey RN.         Prior Anticoagulation Instructions: INR 3.1 Take coumadin 1/2 tablet tonight then resume 1 tablet once daily   Current Anticoagulation Instructions: INR 2.2 Continue coumadin 2.5mg  once daily

## 2010-08-11 NOTE — Miscellaneous (Signed)
Summary: hemoccult cards 03/17/2010  Clinical Lists Changes  Observations: Added new observation of HEMOCCULT 2: neg (03/17/2010 14:59) Added new observation of HEMOCCULT 1: neg (03/17/2010 14:59)

## 2010-08-11 NOTE — Medication Information (Signed)
Summary: ccr-lr  Anticoagulant Therapy  Managed by: Vashti Hey, RN PCP: none Supervising MD: Daleen Squibb MD, Maisie Fus Indication 1: Atrial Fibrillation Lab Used: [facility] [place] Site: [place] INR POC 3.2  Dietary changes: no    Health status changes: no    Bleeding/hemorrhagic complications: no    Recent/future hospitalizations: no    Any changes in medication regimen? no    Recent/future dental: no  Any missed doses?: no       Is patient compliant with meds? yes       Allergies: No Known Drug Allergies  Anticoagulation Management History:      The patient is taking warfarin and comes in today for a routine follow up visit.  Positive risk factors for bleeding include an age of 75 years or older and history of GI bleeding.  The bleeding index is 'intermediate risk'.  Positive CHADS2 values include Age > 37 years old.  The start date was 09/12/2005.  Her last INR was .9.  Anticoagulation responsible Dorette Hartel: Daleen Squibb MD, Maisie Fus.  INR POC: 3.2.  Cuvette Lot#: [id number].  Exp: 10/11.    Anticoagulation Management Assessment/Plan:      The patient's current anticoagulation dose is Warfarin sodium 2.5 mg tabs: Take as directed by anticoagulation clinic.  The target INR is 2.0-3.0.  The next INR is due 07/07/2010.  Anticoagulation instructions were given to patient.  Results were reviewed/authorized by Vashti Hey, RN.  She was notified by Vashti Hey RN.         Prior Anticoagulation Instructions: INR 2.2 Continue coumadin 2.5mg  once daily   Current Anticoagulation Instructions: INR 3.2 Take coumadin 1/2 tablet tonight then resume 1 tablet once daily

## 2010-08-11 NOTE — Miscellaneous (Signed)
Summary: Orders Update: homocult cards  Clinical Lists Changes  Orders: Added new Test order of Hemoccult Cards (Take Home) (Hemoccult Cards) - Signed

## 2010-08-11 NOTE — Medication Information (Signed)
Summary: ccr-lr  Anticoagulant Therapy  Managed by: Vashti Hey, RN Supervising MD: Dietrich Pates MD, Molly Maduro Indication 1: Atrial Fibrillation Lab Used: [facility] [place] Site: [place] INR POC 1.0  Dietary changes: no    Health status changes: yes       Details: hurt rt knee  Bleeding/hemorrhagic complications: no    Recent/future hospitalizations: no    Any changes in medication regimen? yes       Details: started on celebrex 1 qd   Recent/future dental: no  Any missed doses?: no       Is patient compliant with meds? yes       Allergies: No Known Drug Allergies  Anticoagulation Management History:      The patient is taking warfarin and comes in today for a routine follow up visit.  Positive risk factors for bleeding include an age of 20 years or older and history of GI bleeding.  The bleeding index is 'intermediate risk'.  Positive CHADS2 values include Age > 81 years old.  The start date was 09/12/2005.  Her last INR was .9.  Anticoagulation responsible provider: Dietrich Pates MD, Molly Maduro.  INR POC: 1.0.  Cuvette Lot#: [id number].  Exp: 10/11.    Anticoagulation Management Assessment/Plan:      The patient's current anticoagulation dose is Warfarin sodium 2.5 mg tabs: take one half tablet daily as directed by anticoagulation clinic.  The target INR is 2.0-3.0.  The next INR is due 08/25/2009.  Anticoagulation instructions were given to patient.  Results were reviewed/authorized by Vashti Hey, RN.  She was notified by Vashti Hey RN.         Prior Anticoagulation Instructions: INR 1.1 Take coumadin 2 tablets tonight and tomorrow night then resume 1 tablet once daily except 1 1/2 tablets on Fridays  Current Anticoagulation Instructions: INR 1.0 Take coumadin 1 tablet once daily except 1 1/2 tablets on Fridays  pt states she has been taking coumadin but she is very forgetful.  Pt does not have children close by.  Has 2 sisters in area.   Have recommended pt  use a pill box for taking coumadin.  Gave pt pill box today and prefilled it for pt.  Will see her back in 1 week to monitor status.  If pt not taking correctly, will discuss with MD.

## 2010-08-11 NOTE — Medication Information (Signed)
Summary: ccr-lr  Anticoagulant Therapy  Managed by: Vashti Hey, RN PCP: none Supervising MD: Diona Browner MD, Remi Deter Indication 1: Atrial Fibrillation Lab Used: [facility]  Site: [place] INR POC 3.1  Dietary changes: no    Health status changes: no    Bleeding/hemorrhagic complications: no    Recent/future hospitalizations: no    Any changes in medication regimen? no    Recent/future dental: no  Any missed doses?: no       Is patient compliant with meds? yes       Allergies: No Known Drug Allergies  Anticoagulation Management History:      The patient is taking warfarin and comes in today for a routine follow up visit.  Positive risk factors for bleeding include an age of 75 years or older and history of GI bleeding.  The bleeding index is 'intermediate risk'.  Positive CHADS2 values include Age > 60 years old.  The start date was 09/12/2005.  Her last INR was .9.  Anticoagulation responsible provider: Diona Browner MD, Remi Deter.  INR POC: 3.1.  Cuvette Lot#: [id number].  Exp: 10/11.    Anticoagulation Management Assessment/Plan:      The patient's current anticoagulation dose is Warfarin sodium 2.5 mg tabs: Take as directed by anticoagulation clinic.  The target INR is 2.0-3.0.  The next INR is due 05/19/2010.  Anticoagulation instructions were given to patient.  Results were reviewed/authorized by Vashti Hey, RN.  She was notified by Vashti Hey RN.         Prior Anticoagulation Instructions: INR 3.0 Continue coumadin 2.5mg  once daily   Current Anticoagulation Instructions: INR 3.1 Take coumadin 1/2 tablet tonight then resume 1 tablet once daily

## 2010-08-11 NOTE — Progress Notes (Signed)
Summary: called re. past due coumadin appt  Phone Note Outgoing Call   Call placed by: Vashti Hey RN Call placed to: Patient Reason for Call: Discuss lab or test results Summary of Call: Called pt to remind her she is past due for INR check.  Had multiple excuses why she hadn't been.  Very frazzled and forgetful over the phone.  Made appt for pt to come in for INR check on 08/18/09.  Pt states she will try to get here.  Told pt to bring coumadin bottle with her when she comes.   Initial call taken by: Vashti Hey RN,  August 11, 2009 12:58 PM

## 2010-08-11 NOTE — Medication Information (Signed)
Summary: ccr-lr  Anticoagulant Therapy  Managed by: Vashti Hey, RN Supervising MD: Dietrich Pates MD, Molly Maduro Indication 1: Atrial Fibrillation Lab Used: [facility] [place] Site: [place] INR POC 2.4  Dietary changes: no    Health status changes: no    Bleeding/hemorrhagic complications: no    Recent/future hospitalizations: no    Any changes in medication regimen? no    Recent/future dental: no  Any missed doses?: no       Is patient compliant with meds? yes       Allergies: No Known Drug Allergies  Anticoagulation Management History:      The patient is taking warfarin and comes in today for a routine follow up visit.  Positive risk factors for bleeding include an age of 75 years or older and history of GI bleeding.  The bleeding index is 'intermediate risk'.  Positive CHADS2 values include Age > 60 years old.  The start date was 09/12/2005.  Her last INR was .9.  Anticoagulation responsible provider: Dietrich Pates MD, Molly Maduro.  INR POC: 2.4.  Cuvette Lot#: [id number].  Exp: 10/11.    Anticoagulation Management Assessment/Plan:      The patient's current anticoagulation dose is Warfarin sodium 2.5 mg tabs: Take as directed by anticoagulation clinic.  The target INR is 2.0-3.0.  The next INR is due 01/03/2010.  Anticoagulation instructions were given to patient.  Results were reviewed/authorized by Vashti Hey, RN.  She was notified by Vashti Hey RN.         Prior Anticoagulation Instructions: INR 1.4 Take coumadin 2 tablets tonight and tomorrow night then resume 1 tablet once daily except 1 1/2 tablets on Fridays  Current Anticoagulation Instructions: INR 2.4 Continue coumadin 2.5mg  once daily except 3.75mg  on Fridays

## 2010-08-11 NOTE — Medication Information (Signed)
Summary: ccr-lr  Anticoagulant Therapy  Managed by: Vashti Hey, RN Supervising MD: Diona Browner MD, Remi Deter Indication 1: Atrial Fibrillation Lab Used: [facility] [place] Site: [place] INR POC 2.7  Dietary changes: no    Health status changes: no    Bleeding/hemorrhagic complications: no    Recent/future hospitalizations: no    Any changes in medication regimen? no    Recent/future dental: no  Any missed doses?: no       Is patient compliant with meds? yes       Allergies: No Known Drug Allergies  Anticoagulation Management History:      The patient is taking warfarin and comes in today for a routine follow up visit.  Positive risk factors for bleeding include an age of 75 years or older and history of GI bleeding.  The bleeding index is 'intermediate risk'.  Positive CHADS2 values include Age > 36 years old.  The start date was 09/12/2005.  Her last INR was .9.  Anticoagulation responsible provider: Diona Browner MD, Remi Deter.  INR POC: 2.7.  Cuvette Lot#: [id number].  Exp: 10/11.    Anticoagulation Management Assessment/Plan:      The patient's current anticoagulation dose is Warfarin sodium 2.5 mg tabs: take one half tablet daily as directed by anticoagulation clinic.  The target INR is 2.0-3.0.  The next INR is due 09/15/2009.  Anticoagulation instructions were given to patient.  Results were reviewed/authorized by Vashti Hey, RN.  She was notified by Vashti Hey RN.         Prior Anticoagulation Instructions: INR 1.6 Continue coumadin 2.5mg  once daily except 3.75mg  on Fridays Pt is using pill box for coumadin now.  Pill box prefilled for pt todays Recheck in 1 week  Current Anticoagulation Instructions: INR 2.7 Continue coumadin 1 tablet once daily except 1 1/2 tablets on Fridays

## 2010-08-11 NOTE — Medication Information (Signed)
Summary: PROTIME/TG  Anticoagulant Therapy  Managed by: Vashti Hey, RN Supervising MD: Diona Browner MD, Remi Deter Indication 1: Atrial Fibrillation Lab Used: [facility] [place] Site: [place] INR POC 3.0  Dietary changes: no    Health status changes: no    Bleeding/hemorrhagic complications: no    Recent/future hospitalizations: no    Any changes in medication regimen? no    Recent/future dental: no  Any missed doses?: no       Is patient compliant with meds? yes       Allergies: No Known Drug Allergies  Anticoagulation Management History:      The patient is taking warfarin and comes in today for a routine follow up visit.  Positive risk factors for bleeding include an age of 75 years or older and history of GI bleeding.  The bleeding index is 'intermediate risk'.  Positive CHADS2 values include Age > 14 years old.  The start date was 09/12/2005.  Her last INR was .9.  Anticoagulation responsible provider: Diona Browner MD, Remi Deter.  INR POC: 3.0.  Cuvette Lot#: [id number].  Exp: 10/11.    Anticoagulation Management Assessment/Plan:      The patient's current anticoagulation dose is Warfarin sodium 2.5 mg tabs: Take as directed by anticoagulation clinic.  The target INR is 2.0-3.0.  The next INR is due 11/04/2009.  Anticoagulation instructions were given to patient.  Results were reviewed/authorized by Vashti Hey, RN.  She was notified by Vashti Hey RN.         Prior Anticoagulation Instructions: INR 2.7 Continue coumadin 2.5mg  once daily except 3.75mg  on Fridays  Current Anticoagulation Instructions: INR 3.0 Continue coumadin 5mg  once daily except 7.5mg  on Fridays

## 2010-08-11 NOTE — Medication Information (Signed)
Summary: ccr-lr  Anticoagulant Therapy  Managed by: Vashti Hey, RN Supervising MD: Dietrich Pates MD, Molly Maduro Indication 1: Atrial Fibrillation Lab Used: [facility] [place] Site: [place] INR POC 1.8  Dietary changes: no    Health status changes: no    Bleeding/hemorrhagic complications: no    Recent/future hospitalizations: no    Any changes in medication regimen? no    Recent/future dental: no  Any missed doses?: no       Is patient compliant with meds? yes       Allergies: No Known Drug Allergies  Anticoagulation Management History:      The patient is taking warfarin and comes in today for a routine follow up visit.  Positive risk factors for bleeding include an age of 21 years or older and history of GI bleeding.  The bleeding index is 'intermediate risk'.  Positive CHADS2 values include Age > 66 years old.  The start date was 09/12/2005.  Her last INR was .9.  Anticoagulation responsible provider: Dietrich Pates MD, Molly Maduro.  INR POC: 1.8.  Cuvette Lot#: [id number].  Exp: 10/11.    Anticoagulation Management Assessment/Plan:      The patient's current anticoagulation dose is Warfarin sodium 2.5 mg tabs: Take as directed by anticoagulation clinic.  The target INR is 2.0-3.0.  The next INR is due 02/21/2010.  Anticoagulation instructions were given to patient.  Results were reviewed/authorized by Vashti Hey, RN.  She was notified by Vashti Hey RN.         Prior Anticoagulation Instructions: INR 2.9 Continue coumadin 2.5mg  once daily except 3.75mg  on Fridays  Current Anticoagulation Instructions: INR 1.8 Take coumadin 2 tablets tonight then resume 1 tablet once daily except 1 1/2 tablets on Fridays

## 2010-08-11 NOTE — Assessment & Plan Note (Signed)
Summary: f/u tests to be done on 03/03/10/tg  Medications Added METOPROLOL SUCCINATE 50 MG XR24H-TAB (METOPROLOL SUCCINATE) take 1 tablet by mouth once daily CELEBREX 200 MG CAPS (CELECOXIB) take 1 tab daily      Allergies Added: NKDA  Visit Type:  Follow-up Referring Provider:  GI-Dr. Karilyn Cota; GYN- Dr. Gilford Silvius Primary Provider:  none   History of Present Illness: Ms. [name] returns to the office for continued assessment and treatment of atrial arrhythmias.  She was last evaluated by Dr. Tenny Craw in 2008.  We have followed her in our anticoagulation clinic, but have not arranged for continuing physician involvement.  She also has not had a primary care physician since Dr. Jen Mow left [place].  Nonetheless, she has done well from a medical standpoint.  She has not been hospitalized or required evaluation in the emergency department.  Her family has noted deteriorating memory.  Patient denies chest discomfort, dyspnea, orthopnea, PND, lightheadedness, syncope or palpitations, except when she exerts herself.  EKG  Procedure date:  03/11/2010  Findings:      Rhythm Strip  Atrial fibrillation/flutter with a rapid ventricular response Heart rate of 115 bpm   Current Medications (verified): 1)  Viactiv 500-100-40  Chew (Calcium-Vitamin D-Vitamin K) .Marland Kitchen.. 1 By Mouth Once Daily 2)  Warfarin Sodium 2.5 Mg Tabs (Warfarin Sodium) .... Take As Directed By Anticoagulation Clinic 3)  Metoprolol Succinate 50 Mg Xr24h-Tab (Metoprolol Succinate) .... Take 1 Tablet By Mouth Once Daily 4)  Furosemide 20 Mg Tabs (Furosemide) .... Take One Tablet By Mouth Daily X 2 Days Then As Needed For Swelling 5)  Celebrex 200 Mg Caps (Celecoxib) .... Take 1 Tab Daily  Allergies (verified): No Known Drug Allergies  Past History:  Past Surgical History: Last updated: 03/11/2010 Appendectomy-2002 Cholecystectomy-1990's Hysterectomy with BSO and lymph node dissection-2002 Colonoscopy in 2/07-no recurrence  of polyps  Family History: Last updated: 06-12-07 father-deceased-76-prostate cancer mother-90 deceased--hypothyroidism sister x2- daughter-47 son-51  Social History: Last updated: 03/11/2010 Former Smoker-1/2ppd x20 years-quit many years ago Alcohol use-no Drug use-no Marital-widowed; resides alone; 2 children  Past Medical History: Atrial fibrillation-anticoagulation; unremarkable echo and TSH in 2007 Tobacco abuse-remote: 10 pack years Upper GI bleed secondary to gastritis-2008; gastric polyps also noted Depression/anxiety Macular degeneration Colonic polyps-resected in 2005: Pathology showed adenoma with intramucosal carcinoma Uterine cancer-treated with surgery, chemotherapy and radiation therapy in approximately 2000 Diverticulosis  Review of Systems       See history of present illness.  Vital Signs:  Patient profile:   77 year old female Weight:      144 pounds Pulse rate:   114 / minute BP sitting:   127 / 90  (right arm)  Vitals Entered By: Dreama Saa, CNA (March 11, 2010 12:59 PM)  Physical Exam  General:  Slightly disheveled; well developed; no acute distress; vague regarding historical details or details of current activities Neck-No JVD; no carotid bruits: Lungs-No tachypnea, no rales; no rhonchi; no wheezes: Cardiovascular-normal PMI; normal S1 and S2; rapid and irregular rhythm Abdomen-BS normal; soft and non-tender without masses or organomegaly:  Musculoskeletal-No deformities, no cyanosis or clubbing: Neurologic-HIF-impaired; good comprehension; conversant, but easily loses the thread of conversation Frequently repeats herself. Normal cranial nerves; symmetric strength and tone:  Skin-Warm, no significant lesions: Extremities-Nl distal pulses; 1/2+ edema:       Impression & Recommendations:  Problem # 1:  ATRIAL FIBRILLATION (ICD-427.31) Patient has been and continues to be asymptomatic despite suboptimal control of heart rate.   Metoprolol will be increased  to 50 mg q.d. with continuing monitoring of heart rate in our Anticoagulation Clinic.  Problem # 2:  ANTICOAGULATION (ICD-V58.61) Stool for Hemoccult testing and a CBC will be obtained to exclude occult bleeding related to anticoagulation  Patient is strongly encouraged to secure the services of a primary care physician.  She was given a list of practices currently open to new patients.  A return office visit is anticipated in one year.  Problem # 3:  DEMENTIA (ICD-294.8) Patient clearly has impairment of memory and cognition.  I will leave further assessment of this problem to her new primary care physician.  Other Orders: T-CBC w/Diff [phone]) T-Comprehensive Metabolic Panel [phone]) T-Lipid Profile ([id number]) Hemoccult Cards (Take Home) (Hemoccult Cards)  Patient Instructions: 1)  Your physician recommends that you schedule a follow-up appointment in: 1 year 2)  Your physician recommends that you return for lab work in: today 3)  Your physician has recommended you make the following change in your medication: Increase Metoprolol to 50mg  by mouth once daily  4)  You have been referred to Dr. Margo Aye. 5)  Your physician has asked that you test your stool for blood. It is necessary to test 3 different stool specimens for accuracy. You will be given 3 hemoccult cards for specimen collection. For each stool specimen, place a small portion of stool sample (from 2 different areas of the stool) into the 2 squares on the card. Close card. Repeat with 2 more stool specimens. Bring the cards back to the office for testing. Prescriptions: METOPROLOL SUCCINATE 50 MG XR24H-TAB (METOPROLOL SUCCINATE) take 1 tablet by mouth once daily  #30 x 11   Entered by:   Larita Fife Via LPN   Authorized by:   Kathlen Brunswick, MD, [facility]   Signed by:   Larita Fife Via LPN on 57/84/6962   Method used:   Electronically to        [facility]. [phone]* (retail)       [address]        [place], [place]  [address]       Ph: [phone] or [phone]       Fax: [phone]   RxID:   [id number]

## 2010-08-11 NOTE — Letter (Signed)
Summary: Appointment - Missed  [facility] [place]  [address], [place] [address]   Phone: [phone]  Fax: [phone]     September 15, 2009 MRN: [id number]   [name] [address] [place], [place]  [address]   Dear [name],  Our records indicate you missed your appointment on 09/15/09 WITH COUMADIN CLINIC  It is very important that we reach you to reschedule this appointment. We look forward to participating in your health care needs. Please contact us at the number listed above at your earliest convenience to reschedule this appointment.     Sincerely,    Glass blower/designer

## 2010-08-11 NOTE — Assessment & Plan Note (Signed)
Summary: PER LISA FOR LEGS SWELLING/TG  Medications Added METOPROLOL SUCCINATE 25 MG XR24H-TAB (METOPROLOL SUCCINATE) Take one tablet by mouth daily FUROSEMIDE 20 MG TABS (FUROSEMIDE) Take one tablet by mouth daily x 2 days then as needed for swelling      Allergies Added: NKDA  Visit Type:  Follow-up Primary Provider:  npm  CC:  EDEMA IN FEET.  History of Present Illness: Mrs. [name] is a pleasant 77 y/o CF with known history of Atrial-fib but has not been seen by cardiology since 2008. She is followed in our coumadin clinic and on last visit there complained of LEE which has been going on for a few months.  She is easily confused and has poor memory. She was not aware that she had atrial fib or why she was on coumadin. She does not have a primary care physician and has not seen a physician in many years, stating that she does not have any health issues.  Preventive Screening-Counseling & Management  Alcohol-Tobacco     Alcohol drinks/day: 0     Smoking Status: never  Current Medications (verified): 1)  Lexapro 10 Mg  Tabs (Escitalopram Oxalate) .Marland Kitchen.. 1 By Mouth Once Daily 2)  Viactiv 500-100-40  Chew (Calcium-Vitamin D-Vitamin K) .Marland Kitchen.. 1 By Mouth Once Daily 3)  Warfarin Sodium 2.5 Mg Tabs (Warfarin Sodium) .... Take As Directed By Anticoagulation Clinic 4)  Hydrocodone-Acetaminophen 5-500 Mg  Tabs (Hydrocodone-Acetaminophen) .Marland Kitchen.. 1 By Mouth Q 6 Hours As Needed Pain 5)  Metoprolol Succinate 25 Mg Xr24h-Tab (Metoprolol Succinate) .... Take One Tablet By Mouth Daily 6)  Furosemide 20 Mg Tabs (Furosemide) .... Take One Tablet By Mouth Daily X 2 Days Then As Needed For Swelling  Allergies (verified): No Known Drug Allergies  Past History:  Past medical, surgical, family and social histories (including risk factors) reviewed, and no changes noted (except as noted below).  Past Medical History: Reviewed history from 05/29/2007 and no changes required. Anemia-NOS Anxiety Atrial  fibrillation Depression macular degeneration colonic polyps uterine cancer  Past Surgical History: Reviewed history from 05/29/2007 and no changes required. Appendectomy-2002 Cholecystectomy-1990's Hysterectomy with BSO and lymph node dissection-2002  Family History: Reviewed history from 05/29/2007 and no changes required. father-deceased-76-prostate cancer mother-90 deceased--hypothyroidism sister x2- daughter-47 son-51  Social History: Reviewed history from 05/29/2007 and no changes required. Former Smoker-1/2ppd x20 years-quit many years ago Alcohol use-no Drug use-no widowed lives aloneAlcohol drinks/day:  0 Smoking Status:  never  Review of Systems       Lower extremity edema  All other systems have been reviewed and are negative unless stated above.   Vital Signs:  Patient profile:   77 year old female Weight:      149 pounds BMI:     26.92 O2 Sat:      98 % on Room air Temp:     97.4 degrees F Pulse rate:   123 / minute BP sitting:   117 / 68  (right arm)  Vitals Entered By: Dreama Saa, CNA (March 01, 2010 1:31 PM)  O2 Flow:  Room air  Physical Exam  General:  healthy appearing.   Head:  normocephalic and atraumatic Eyes:  PERRLA/EOM intact; conjunctiva and lids normal. Lungs:  Clear bilaterally to auscultation and percussion. Heart:  Irregular, rapid without MRG.   Abdomen:  Bowel sounds positive; abdomen soft and non-tender without masses, organomegaly, or hernias noted. No hepatosplenomegaly. Msk:  Kyphosis is noted Extremities:  2+ left pedal edema and 1+ right pedal edema.  Neurologic:  Alert and oriented x 3. Psych:  poor concentration and poor memory.     EKG  Procedure date:  03/01/2010  Findings:      Atrial fibrillation with an uncontrolled ventricular response rate of: 103 bpm Non-specific ST-T wave changes noted.    Impression & Recommendations:  Problem # 1:  ATRIAL FIBRILLATION (ICD-427.31) Mrs Lebo is not on any rate  control medications. HR is elevated.  Will start Metoprolol 25mg  daily.  This should also assist with better cardiac output.  TSH is also ordered secondary to elevated TSH of 6.004 and she is on no medications for this.  We will repeat her echocardiogram for LV fx. 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 She did not know she had afib or understand the reasoning for the follow-up testing. I wrote down the information for her including the medications, tests, and also names of physicians who are excepting patients.  It is important that she be established with primary care for geriatric issues.  Her updated medication list for this problem includes:    Warfarin Sodium 2.5 Mg Tabs (Warfarin sodium) .Marland Kitchen... Take as directed by anticoagulation clinic    Metoprolol Succinate 25 Mg Xr24h-tab (Metoprolol succinate) .Marland Kitchen... Take one tablet by mouth daily  Orders: T-TSH [phone]) T-Basic Metabolic Panel [phone]) 2-D Echocardiogram (2D Echo)  Problem # 2:  EDEMA (ICD-782.3) Will start low dose Lasix for edema daily for 2 days and then on as needed basis.  Lower extremity dopplers will be obtained.  Low liklihood of DVT on coumadin, but will assess for hematoma or other etiology for edema. Orders: Venous Duplex Lower Extremity (Venous Dup Lower E)  Other Orders: T- * Misc. Laboratory test [id number])  Patient Instructions: 1)  Your physician recommends that you schedule a follow-up appointment in: after tests 2)  Your physician recommends that you return for lab work [id number] 3)  Your physician has recommended you make the following change in your medication:  take furosemide 20mg  daily x2 days then daily as needed for swelling, metoprolol succinate 25mg  daily 4)  Your physician has requested that you have a lower or upper extremity venous duplex.  This test is an ultrasound of the veins in the legs or arms.  It looks at venous  blood flow that carries blood from the heart to the legs or arms.  Allow one hour for a Lower Venous exam.  Allow thirty minutes for an Upper Venous exam. There are no restrictions or special instructions. 5)  Your physician has requested that you have an echocardiogram.  Echocardiography is a painless test that uses sound waves to create images of your heart. It provides your doctor with information about the size and shape of your heart and how well your heart's chambers and valves are working.  This procedure takes approximately one hour. There are no restrictions for this procedure. Prescriptions: FUROSEMIDE 20 MG TABS (FUROSEMIDE) Take one tablet by mouth daily x 2 days then as needed for swelling  #30 x 3   Entered by:   Teressa Lower RN   Authorized by:   Joni Reining, NP   Signed by:   Teressa Lower RN on 03/01/2010   Method used:   [facility]        [facility] [phone][facility] (retail)       [address]       [place]  [place], [place]  [address]       Ph: [phone] or [phone]       Fax: [phone]   RxID:   [id number] METOPROLOL SUCCINATE 25 MG XR24H-TAB (METOPROLOL SUCCINATE) Take one tablet by mouth daily  #30 x 3   Entered by:   Teressa Lower RN   Authorized by:   Joni Reining, NP   Signed by:   Teressa Lower RN on 03/01/2010   Method used:   [facility]ronically to        [facility] [phone]* (retail)       [address]       [place], [place]  [address]       Ph: [phone] or [phone]       Fax: [phone]   RxID:   [id number]

## 2010-08-11 NOTE — Progress Notes (Signed)
Summary: wants to tak to [name]/coumadin refill  Medications Added WARFARIN SODIUM 2.5 MG TABS (WARFARIN SODIUM) Take as directed by anticoagulation clinic       Phone Note Refill Request Call back at Home Phone [phone]   Refills Requested: Medication #1:  WARFARIN SODIUM 2.5 MG TABS take one half tablet daily as directed by anticoagulation clinic Initial call taken by: Vashti Hey RN,  September 29, 2009 11:14 AM Caller: pt Reason for Call: Talk to Nurse Summary of Call: pt would like to speak with Misty Stanley about coumadin. Initial call taken by: Faythe Ghee,  September 29, 2009 11:05 AM  Follow-up for Phone Call        Pt requested warfarin refill.  [facility]  [facility] Follow-up by: Vashti Hey RN,  September 29, 2009 11:16 AM    New/Updated Medications: WARFARIN SODIUM 2.5 MG TABS (WARFARIN SODIUM) Take as directed by anticoagulation clinic Prescriptions: WARFARIN SODIUM 2.5 MG TABS (WARFARIN SODIUM) Take as directed by anticoagulation clinic  #60 x 2   Entered by:   Vashti Hey RN   Authorized by:   Kathlen Brunswick, MD, [facility]   Signed by:   Vashti Hey RN on 09/29/2009   Method used:   [facility]        [facility] [phone]* (retail)       [address]       [place], [place]  [address]       Ph: [phone] or [phone]       Fax: [phone]   RxID:   [id number] WARFARIN SODIUM 2.5 MG TABS (WARFARIN SODIUM) take one half tablet daily as directed by anticoagulation clinic  #60 x 2   Entered by:   Vashti Hey RN   Authorized by:   Kathlen Brunswick, MD, [facility]   Signed by:   Vashti Hey RN on 09/29/2009   Method used:   [facility]ically to        [facility] [phone]* (retail)       [address]       [place], [place]  [address]       Ph: [phone] or [phone]       Fax: [phone]   RxID:   [id number]

## 2010-08-11 NOTE — Progress Notes (Signed)
Summary: test issues   Phone Note Outgoing Call   Call placed by: Dreama Saa, CNA,  March 09, 2010 10:12 AM Summary of Call: called Mrs.Chenette about her test she was supposed to have done which was a echo and venous dopplers  and bmp,tsh  patient was very confused about the test issues so i  informed her we was going to cancel her appt for friday 03/11/2010 with rothbart being she was coming back for test results and i called her sister to let her know that Mrs .Butner missed her test and her sister stated that she was becoming more forgetful so asked that we rescedule all her test and she will come with Mrs.Croker to make sure they get done.her sisters number is [phone].  Initial call taken by: Dreama Saa, CNA,  March 09, 2010 10:19 AM

## 2010-08-11 NOTE — Medication Information (Signed)
Summary: ccr-lr  Anticoagulant Therapy  Managed by: Vashti Hey, RN PCP: none Supervising MD: Dietrich Pates MD, Molly Maduro Indication 1: Atrial Fibrillation Lab Used: [facility]  Site: [place] INR POC 3.2  Dietary changes: no    Health status changes: no    Bleeding/hemorrhagic complications: no    Recent/future hospitalizations: no    Any changes in medication regimen? no    Recent/future dental: no  Any missed doses?: no       Is patient compliant with meds? yes       Allergies: No Known Drug Allergies  Anticoagulation Management History:      The patient is taking warfarin and comes in today for a routine follow up visit.  Positive risk factors for bleeding include an age of 75 years or older and history of GI bleeding.  The bleeding index is 'intermediate risk'.  Positive CHADS2 values include Age > 75 years old.  The start date was 09/12/2005.  Her last INR was .9.  Anticoagulation responsible provider: Dietrich Pates MD, Molly Maduro.  INR POC: 3.2.  Cuvette Lot#: [id number].  Exp: 10/11.    Anticoagulation Management Assessment/Plan:      The patient's current anticoagulation dose is Warfarin sodium 2.5 mg tabs: Take as directed by anticoagulation clinic.  The target INR is 2.0-3.0.  The next INR is due 08/04/2010.  Anticoagulation instructions were given to patient.  Results were reviewed/authorized by Vashti Hey, RN.  She was notified by Vashti Hey RN.         Prior Anticoagulation Instructions: INR 3.2 Take coumadin 1/2 tablet tonight then resume 1 tablet once daily   Current Anticoagulation Instructions: INR 3.2 Decrease coumadin to 1 tablet once daily except 1/2 tablet on Thursdays

## 2010-08-11 NOTE — Letter (Signed)
Summary: [place] [facility], agricultural at [facility]  [address], [place] [address]   Phone: [phone]  Fax: [phone]      March 31, 2010 MRN: [id number]   [name] [address] [place], [place]  [address]   Dear [name],  Your test ordered by Selena Batten has been reviewed by your physician (or physician assistant) and was found to be normal or stable. Your physician (or physician assistant) felt no changes were needed at this time.  ____ Echocardiogram  ____ Cardiac Stress Test  __x__ Lab Work  ____ Peripheral vascular study of arms, legs or neck  ____ CT scan or X-ray  ____ Lung or Breathing test  __x__ Other: stool cards   No change in medical treatment at this time, per Dr. Dietrich Pates.  Thank you, Ana Allyne Gee RN    Geneva Bing, MD, Lenise Arena.C.Gaylord Shih, MD, F.A.C.C Lewayne Bunting, MD, F.A.C.C Nona Dell, MD, F.A.C.C Charlton Haws, MD, Lenise Arena.C.C

## 2010-08-11 NOTE — Progress Notes (Signed)
Summary: FYI   Phone Note Call from Patient Call back at [facility] Phone [phone]   Caller: PT Reason for Call: Talk to Nurse Summary of Call: FYI PT HAS HURT HER KNEE AND IS UNABLE TO WALK SHE PLANS ON BEING HERE FOR APPT NEXT WED 08/18/09. Initial call taken by: Faythe Ghee,  August 12, 2009 10:43 AM  Follow-up for Phone Call        Phone Call Completed Follow-up by: Vashti Hey RN,  August 12, 2009 10:46 AM

## 2010-08-11 NOTE — Medication Information (Signed)
Summary: protime/t  Anticoagulant Therapy  Managed by: Vashti Hey, RN PCP: none Supervising MD: Dietrich Pates MD, Molly Maduro Indication 1: Atrial Fibrillation Lab Used: [facility] [place] Site: [place] INR POC 2.9  Dietary changes: no    Health status changes: no    Bleeding/hemorrhagic complications: no    Recent/future hospitalizations: no    Any changes in medication regimen? no    Recent/future dental: no  Any missed doses?: no       Is patient compliant with meds? yes       Allergies: No Known Drug Allergies  Anticoagulation Management History:      The patient is taking warfarin and comes in today for a routine follow up visit.  Positive risk factors for bleeding include an age of 75 years or older and history of GI bleeding.  The bleeding index is 'intermediate risk'.  Positive CHADS2 values include Age > 68 years old.  The start date was 09/12/2005.  Her last INR was .9.  Anticoagulation responsible provider: Dietrich Pates MD, Molly Maduro.  INR POC: 2.9.  Cuvette Lot#: [id number].  Exp: 10/11.    Anticoagulation Management Assessment/Plan:      The patient's current anticoagulation dose is Warfarin sodium 2.5 mg tabs: Take as directed by anticoagulation clinic.  The target INR is 2.0-3.0.  The next INR is due 04/14/2010.  Anticoagulation instructions were given to patient.  Results were reviewed/authorized by Vashti Hey, RN.  She was notified by Vashti Hey RN.         Prior Anticoagulation Instructions: INR 4.7 Hold coumadin tonight, Take 1 tablet tomorrow night then take 1 tablet every night till I see you back on Thursday 04/07/10.  (Regular dose is 2.5mg  once daily except 3.75mg  on Fridays)  Current Anticoagulation Instructions: INR 2.9 Today is Thursday. Take 1 tablet every night until you come back next Thursday 04/14/10

## 2010-08-11 NOTE — Medication Information (Signed)
Summary: ccr-lr  Anticoagulant Therapy  Managed by: Vashti Hey, RN PCP: none Supervising MD: Dietrich Pates MD, Molly Maduro Indication 1: Atrial Fibrillation Lab Used: [facility] [place] Site: [place] INR POC 4.6  Dietary changes: no    Health status changes: no    Bleeding/hemorrhagic complications: no    Recent/future hospitalizations: no    Any changes in medication regimen? yes       Details: Has been taking more celebrex this month  Recent/future dental: no  Any missed doses?: no       Is patient compliant with meds? yes       Allergies: No Known Drug Allergies  Anticoagulation Management History:      The patient is taking warfarin and comes in today for a routine follow up visit.  Positive risk factors for bleeding include an age of 75 years or older and history of GI bleeding.  The bleeding index is 'intermediate risk'.  Positive CHADS2 values include Age > 14 years old.  The start date was 09/12/2005.  Her last INR was .9.  Anticoagulation responsible provider: Dietrich Pates MD, Molly Maduro.  INR POC: 4.6.  Cuvette Lot#: [id number].  Exp: 10/11.    Anticoagulation Management Assessment/Plan:      The patient's current anticoagulation dose is Warfarin sodium 2.5 mg tabs: Take as directed by anticoagulation clinic.  The target INR is 2.0-3.0.  The next INR is due 08/18/2010.  Anticoagulation instructions were given to patient.  Results were reviewed/authorized by Vashti Hey, RN.  She was notified by Vashti Hey RN.         Prior Anticoagulation Instructions: INR 3.2 Decrease coumadin to 1 tablet once daily except 1/2 tablet on Thursdays  Current Anticoagulation Instructions: INR 4.6 Hold coumadin tonight then decrese dose to 2.5mg  once daily except 1.25mg  on Tuesdays and Fridays

## 2010-08-18 ENCOUNTER — Encounter: Payer: Self-pay | Admitting: Cardiology

## 2010-08-18 ENCOUNTER — Encounter (INDEPENDENT_AMBULATORY_CARE_PROVIDER_SITE_OTHER): Payer: Medicare Other

## 2010-08-18 DIAGNOSIS — Z7901 Long term (current) use of anticoagulants: Secondary | ICD-10-CM

## 2010-08-18 DIAGNOSIS — I4891 Unspecified atrial fibrillation: Secondary | ICD-10-CM

## 2010-08-25 NOTE — Medication Information (Signed)
Summary: ccr-lr  Anticoagulant Therapy Managed by: Vashti Hey, RN Patient Assessment Part 2:  Have you MISSED ANY DOSES or CHANGED TABLETS?  -17  Have you had any BRUISING or BLEEDING ( nose or gum bleeds,blood in urine or stool)?  Have you STARTED or STOPPED any MEDICATIONS, including OTC meds,herbals or supplements?  Have you CHANGED your DIET, especially green vegetables,or ALCOHOL intake?  Have you had any ILLNESSES or HOSPITALIZATIONS?  Have you had any signs of CLOTTING?(chest discomfort,dizziness,shortness of breath,arms tingling,slurred speech,swelling or redness in leg)      New  Tablet strength: : 2.5mg  Regimen Out:     Sunday: 0.5 tab Tablet     Monday: 0.5 tab Tablet     Tuesday: 1 tab Tablet     Wednesday: 0.5 tab Tablet     Thursday: 1 tab Tablet      Friday: 0.5 tab Tablet     Saturday: 1 tab Tablet Total Weekly: 12.50 mg mg  Next INR Due: 09/01/2010      Allergies: No Known Drug Allergies  Anticoagulant Therapy  Managed by: Vashti Hey, RN PCP: none Supervising MD: Diona Browner MD, Remi Deter Indication 1: Atrial Fibrillation Lab Used: [facility] [place] Site: [place] INR POC 4.7  Dietary changes: no    Health status changes: no    Bleeding/hemorrhagic complications: no    Recent/future hospitalizations: no    Any changes in medication regimen? no    Recent/future dental: no  Any missed doses?: no       Is patient compliant with meds? yes         Anticoagulation Management History:      The patient is taking warfarin and comes in today for a routine follow up visit.  Positive risk factors for bleeding include an age of 15 years or older and history of GI bleeding.  The bleeding index is 'intermediate risk'.  Positive CHADS2 values include Age > 53 years old.  The start date was 09/12/2005.  Her last INR was .9.  Anticoagulation responsible provider: Diona Browner MD, Remi Deter.  INR POC: 4.7.  Cuvette Lot#: [id number].   Exp: 10/11.    Anticoagulation Management Assessment/Plan:      The patient's current anticoagulation dose is Warfarin sodium 2.5 mg tabs: Take as directed by anticoagulation clinic.  The target INR is 2.0-3.0.  The next INR is due 09/01/2010.  Anticoagulation instructions were given to patient.  Results were reviewed/authorized by Vashti Hey, RN.  She was notified by Vashti Hey RN.         Prior Anticoagulation Instructions: INR 4.6 Hold coumadin tonight then decrese dose to 2.5mg  once daily except 1.25mg  on Tuesdays and Fridays  Current Anticoagulation Instructions: INR 4.7 Hold coumadin tonight and tomorrow night then decrease dose to 1.25mg  once daily except 2.5mg  on Tuesdays, Thursdays and Saturdays

## 2010-08-26 ENCOUNTER — Encounter (INDEPENDENT_AMBULATORY_CARE_PROVIDER_SITE_OTHER): Payer: Self-pay

## 2010-08-31 NOTE — Letter (Signed)
Summary: Recall Colonoscopy/Endoscopy, Change to Office Visit  [facility]  [address]   [place], [place] [address]   Phone: [phone]  Fax: [phone]      August 26, 2010   [name] [address] [place], [place]  [address] 1928/02/17   Dear [name],   According to our records, it is time for you to schedule a Colonoscopy/Endoscopy. However, after reviewing your medical record, we recommend an office visit in order to determine your need for a repeat procedure.  Please call [phone] at your convenience to schedule an office visit. If you have any questions or concerns, please feel free to contact our office.   Sincerely,   Cloria Spring LPN  [facility] Ph: [phone]   Fax: [phone]

## 2010-09-01 ENCOUNTER — Encounter: Payer: Self-pay | Admitting: Cardiology

## 2010-09-01 ENCOUNTER — Encounter (INDEPENDENT_AMBULATORY_CARE_PROVIDER_SITE_OTHER): Payer: Medicare Other

## 2010-09-01 DIAGNOSIS — I4891 Unspecified atrial fibrillation: Secondary | ICD-10-CM

## 2010-09-01 DIAGNOSIS — Z7901 Long term (current) use of anticoagulants: Secondary | ICD-10-CM

## 2010-09-01 LAB — CONVERTED CEMR LAB: POC INR: 2.3

## 2010-09-06 NOTE — Medication Information (Signed)
Summary: ccr-lr  Anticoagulant Therapy  Managed by: Vashti Hey, RN PCP: none Supervising MD: Dietrich Pates MD, Molly Maduro Indication 1: Atrial Fibrillation Lab Used: [facility] [place] Site: [place] INR POC 2.3  Dietary changes: no    Health status changes: no    Bleeding/hemorrhagic complications: no    Recent/future hospitalizations: no    Any changes in medication regimen? no    Recent/future dental: no  Any missed doses?: no       Is patient compliant with meds? yes       Allergies: No Known Drug Allergies  Anticoagulation Management History:      The patient is taking warfarin and comes in today for a routine follow up visit.  Positive risk factors for bleeding include an age of 75 years or older and history of GI bleeding.  The bleeding index is 'intermediate risk'.  Positive CHADS2 values include Age > 75 years old.  The start date was 09/12/2005.  Her last INR was .9.  Anticoagulation responsible provider: Dietrich Pates MD, Molly Maduro.  INR POC: 2.3.  Cuvette Lot#: [id number].  Exp: 10/11.    Anticoagulation Management Assessment/Plan:      The patient's current anticoagulation dose is Warfarin sodium 2.5 mg tabs: Take as directed by anticoagulation clinic.  The target INR is 2.0-3.0.  The next INR is due 09/15/2010.  Anticoagulation instructions were given to patient.  Results were reviewed/authorized by Vashti Hey, RN.  She was notified by Vashti Hey RN.         Prior Anticoagulation Instructions: INR 4.7 Hold coumadin tonight and tomorrow night then decrease dose to 1.25mg  once daily except 2.5mg  on Tuesdays, Thursdays and Saturdays  Current Anticoagulation Instructions: INR 2.3 Continue coumadin 1/2 tablet once daily except 1 tablet on Tuesdays, Thursdays and Saturdays

## 2010-09-15 ENCOUNTER — Encounter: Payer: Self-pay | Admitting: Cardiology

## 2010-09-15 ENCOUNTER — Encounter (INDEPENDENT_AMBULATORY_CARE_PROVIDER_SITE_OTHER): Payer: Medicare Other

## 2010-09-15 DIAGNOSIS — Z7901 Long term (current) use of anticoagulants: Secondary | ICD-10-CM

## 2010-09-15 DIAGNOSIS — I4891 Unspecified atrial fibrillation: Secondary | ICD-10-CM

## 2010-09-15 LAB — CONVERTED CEMR LAB: POC INR: 1.6

## 2010-09-20 NOTE — Medication Information (Signed)
Summary: ccr-lr  Anticoagulant Therapy  Managed by: Vashti Hey, RN PCP: none Supervising MD: Diona Browner MD, Remi Deter Indication 1: Atrial Fibrillation Lab Used: [facility] [place] Site: [place] INR POC 1.6  Dietary changes: no    Health status changes: no    Bleeding/hemorrhagic complications: no    Recent/future hospitalizations: no    Any changes in medication regimen? no    Recent/future dental: no  Any missed doses?: no       Is patient compliant with meds? yes       Allergies: No Known Drug Allergies  Anticoagulation Management History:      The patient is taking warfarin and comes in today for a routine follow up visit.  Positive risk factors for bleeding include an age of 75 years or older and history of GI bleeding.  The bleeding index is 'intermediate risk'.  Positive CHADS2 values include Age > 68 years old.  The start date was 09/12/2005.  Her last INR was .9.  Anticoagulation responsible provider: Diona Browner MD, Remi Deter.  INR POC: 1.6.  Cuvette Lot#: [id number].  Exp: 10/11.    Anticoagulation Management Assessment/Plan:      The patient's current anticoagulation dose is Warfarin sodium 2.5 mg tabs: Take as directed by anticoagulation clinic.  The target INR is 2.0-3.0.  The next INR is due 09/29/2010.  Anticoagulation instructions were given to patient.  Results were reviewed/authorized by Vashti Hey, RN.  She was notified by Vashti Hey RN.         Prior Anticoagulation Instructions: INR 2.3 Continue coumadin 1/2 tablet once daily except 1 tablet on Tuesdays, Thursdays and Saturdays  Current Anticoagulation Instructions: INR 1.6 Take coumadin 1 1/2 tablets tonight then increase dose to 1 tablet once daily except 1/2 tablet on Mondays, Wednesdays and Fridays

## 2010-09-24 ENCOUNTER — Encounter: Payer: Self-pay | Admitting: Cardiology

## 2010-09-24 DIAGNOSIS — Z7901 Long term (current) use of anticoagulants: Secondary | ICD-10-CM | POA: Insufficient documentation

## 2010-09-24 DIAGNOSIS — I4891 Unspecified atrial fibrillation: Secondary | ICD-10-CM

## 2010-09-29 ENCOUNTER — Ambulatory Visit (INDEPENDENT_AMBULATORY_CARE_PROVIDER_SITE_OTHER): Payer: Medicare Other | Admitting: *Deleted

## 2010-09-29 DIAGNOSIS — I4891 Unspecified atrial fibrillation: Secondary | ICD-10-CM

## 2010-09-29 DIAGNOSIS — Z7901 Long term (current) use of anticoagulants: Secondary | ICD-10-CM

## 2010-09-29 LAB — POCT INR: INR: 2.8

## 2010-10-14 ENCOUNTER — Other Ambulatory Visit: Payer: Self-pay | Admitting: Cardiology

## 2010-10-20 ENCOUNTER — Ambulatory Visit (INDEPENDENT_AMBULATORY_CARE_PROVIDER_SITE_OTHER): Payer: Medicare Other | Admitting: *Deleted

## 2010-10-20 DIAGNOSIS — I4891 Unspecified atrial fibrillation: Secondary | ICD-10-CM

## 2010-10-20 DIAGNOSIS — Z7901 Long term (current) use of anticoagulants: Secondary | ICD-10-CM

## 2010-10-20 LAB — POCT INR: INR: 3.2

## 2010-11-10 ENCOUNTER — Ambulatory Visit (INDEPENDENT_AMBULATORY_CARE_PROVIDER_SITE_OTHER): Payer: Medicare Other | Admitting: *Deleted

## 2010-11-10 DIAGNOSIS — Z7901 Long term (current) use of anticoagulants: Secondary | ICD-10-CM

## 2010-11-10 DIAGNOSIS — I4891 Unspecified atrial fibrillation: Secondary | ICD-10-CM

## 2010-11-22 NOTE — Assessment & Plan Note (Signed)
[facility]                                 ON-CALL NOTE   NAME:HODGESShikha, [name]                    MRN:          [id number]  DATE:06/10/2007                            DOB:          [birth year]    [name] went today and had a Coumadin check.  I received a call  because her INR was significantly elevated at 7.96 with a PT of 71.5.  I  contacted [name] at home and discussed the situation with her.   [name] is asymptomatic.  Over the Thanksgiving holiday, she had a  fall and was seen in the emergency room, suffering bruises and  contusions but no fractures and no incisions or lacerations requiring  sutures.  She stated she was still sore but recovering well.  She stated  she did not have any new hematomas and had not noticed any hematuria or  any other bleeding.  I advised her that her Coumadin level was too high  and she needed to hold her Coumadin until we told her to restart it.  She stated that she had been compliant with her medications and was very  sure she had not taken too much of the medication.  She also stated that  she had not been on any new medications and had not taken any  antibiotics recently.   I spoke with Dr. Shelby Dubin who advised that because her INR was less  than 9, if she was not having any bleeding or any other symptoms, she  could be followed as an outpatient.  I will therefore arrange for her to  get an INR check at [facility] tomorrow and be followed by  [facility].  [name] was advised to call 9-1-1 and come to  the emergency room if she had any issues with bleeding or bruising.      Theodore Demark, PA-C  Electronically Signed      [name], MD  Electronically Signed   RB/MedQ  DD: 06/10/2007  DT: 06/11/2007  Job #: [id number]

## 2010-11-22 NOTE — Op Note (Signed)
[name], [name]             ACCOUNT NO.:  [id number]   MEDICAL RECORD NO.:  [id number]          PATIENT TYPE:  AMB   LOCATION:  DAY                           FACILITY:  [facility]   PHYSICIAN:  Kassie Mends, M.D.      DATE OF BIRTH:  1928-01-11   DATE OF PROCEDURE:  06/19/2007  DATE OF DISCHARGE:                               OPERATIVE REPORT   PROCEDURE:  Esophagogastroduodenoscopy with cold forceps biopsy.   INDICATION FOR EXAM:  [name] is a 77 year old female who presents  with melena while on Coumadin.  The Coumadin has been stopped for two  weeks.  She also had associated decrease in her hemoglobin.  She denies  any aspirin or NSAID use.  She is not having any pain with swallowing or  difficulty swallowing.  She denies nausea, vomiting, or constipation.  She was having some loose stool yesterday.  The last time she saw black  stools was two days ago.  She denies any abdominal pain.   FINDINGS:  1. Normal esophagus without evidence of Barrett's, mass, erosion,      ulceration or stricture.  2. Multiple gastric polyps seen in the proximal stomach.  Biopsies      obtained via cold forceps.  Diffuse erythema in the body and the      antrum without erosion or ulceration.  A few of the polyps had a      small central ulceration with evidence of old blood.  Cold forceps      biopsies obtained in the body and the antrum to evaluate for H.      pylori gastritis.  3. Superficial ulcerations with surrounding edema seen in the duodenal      bulb and at the junction of D1 and D2.  The ulcerations were also      associated with erythema.  Normal ampulla.   DIAGNOSIS:  The most likely cause of [name] black tarry stools is  gastritis and duodenitis while anticoagulated.   RECOMMENDATIONS:  1. No aspirin or anti-inflammatory drugs for 30 days.  No      anticoagulation for 14 days.  Would be okay to restart Coumadin in      two weeks.  2. Start omeprazole 20 mg daily and avoid  gastric irritants.  She is      given a handout on gastric irritants and gastritis.  3. We will call [name] with the results of her biopsies.   MEDICATIONS:  1. Demerol 50 mg IV.  2. Versed 4 mg IV.   PROCEDURE TECHNIQUE:  Physical exam was performed.  Informed consent was  obtained from the patient after explaining the benefits, risks and  alternatives to the procedure.  The patient was connected to the monitor  and placed in the left lateral position.  Continuous oxygen was provided  by nasal cannula and IV medicine administered through an indwelling  cannula.  After administration of sedation, the patient's esophagus was  intubated with the diagnostic gastroscope and the scope was advanced  under direct visualization to the second portion of the duodenum.  The  scope was removed slowly by carefully examining the color, texture,  anatomy, and integrity of the mucosa on the way out.  The patient was  recovered in endoscopy and discharged home in satisfactory condition.   ADDENDUM:  Fundic gland polyps, benign. Reactive gastropathy-no H. pylori. Avoid  gastic irritants. Continue omerazole. Recheck Hb in one month.      Kassie Mends, M.D.  Electronically Signed     SM/MEDQ  D:  06/19/2007  T:  06/19/2007  Job:  [id number]   cc:   Erle Crocker, M.D.

## 2010-11-25 NOTE — Assessment & Plan Note (Signed)
[facility]                       [place] [facility] OFFICE NOTE   NAME:[name], [name]                    MRN:          [id number]  DATE:10/15/2006                            DOB:          [birth year]    IDENTIFICATION:  [name] is a 77 year old with intermittent atrial  fibrillation.  She was followed in the past by Dr. Dionicio Woods, last  seen in March of last year.   In the interval, she has done fairly well.  She denies palpitations,  notes only occasional dizziness with rapid standing.  No chest pain.  She is active, though she has not been walking with the cold weather.   CURRENT MEDICATIONS:  1. Preserve Vision vitamin.  2. Viactiv vitamin.  3. Coumadin 2.5 mg as directed.  4. Calcium with D.   PHYSICAL EXAMINATION:  On exam, the patient's is in no distress.  Blood pressure is 123/70.  Pulse is 68 and irregular.  Weight 158.  NECK:  JVP is normal.  No bruits.  LUNGS:  Clear.  CARDIAC:  Irregularly irregular, S1 and S2.  No S3.  ABDOMEN:  Benign.  No masses.  No hepatomegaly.  EXTREMITIES:  Good distal pulses.  No edema.   Twelve-lead EKG shows what looks like a multifocal atrial rhythm; I do  not think it is atrial fibrillation; still, it is quite irregular.   PLAN:  1. What I would like to do is continue the patient on her Coumadin and      check another Holter monitor.  We will keep the same regimen for      now.  I counseled her and talked to her about atrial fibrillation.      She was carrying around a book on CHF and I told her that is not      what she had; EF was near low-normal.  2. Healthcare maintenance:  Excellent lipid panel with an LDL of 66      and HDL of 69 a year ago.  Would continue to follow.   FOLLOWUP:  For now in 1 year's time, sooner if problems develop.  We  will be in touch with her regarding the tests results.    Ana Riffle, MD, [facility]  Electronically Signed   PVR/MedQ  DD: 10/15/2006  DT:  10/16/2006  Job #: [id number]   cc:   [name], M.D.

## 2010-11-25 NOTE — Op Note (Signed)
NAMEPANSY, OSTROVSKY             ACCOUNT NO.:  [id number]   MEDICAL RECORD NO.:  [id number]          PATIENT TYPE:  AMB   LOCATION:  DAY                           FACILITY:  [facility]   PHYSICIAN:  Lionel December, M.D.    DATE OF BIRTH:  25-Jul-1927   DATE OF PROCEDURE:  08/23/2005  DATE OF DISCHARGE:                                 OPERATIVE REPORT   PROCEDURE:  Colonoscopy.   INDICATIONS:  [name] is a 77 year old Caucasian female who underwent  screening colonoscopy in September 2005 with removal of a sessile polyp from  the cecum. It was an adenoma with intramucosal carcinoma. However, the  adenoma was seen to the polypectomy margin.  I therefore recommended that  she return for a follow-up examine in a year. She is asymptomatic. Family  history is negative for colorectal carcinoma. Personal history is  significant for uterine carcinoma, and she remains in remission. Procedure  and risks were reviewed with the patient, and informed consent was obtained.   MEDICATIONS FOR CONSCIOUS SEDATION:  Demerol 25 mg IV, Versed 4 mg IV.   FINDINGS:  Procedure performed in endoscopy suite. The patient's vital signs  and O2 saturation were monitored during the procedure and remained stable.  The patient was placed in left lateral position and rectal examination  performed. No abnormality noted on external or digital exam. Olympus  videoscope was placed in rectum and advanced under vision into sigmoid colon  and beyond. Redundant colon. Multiple small to medium types size diverticula  were noted at sigmoid colon with few more at descending colon. Scope was  passed to cecum which was identified by appendiceal orifice and ileocecal  valve. Cecal-like lipoma was noted as on the previous exam, about 8 x 10 mm.  The fold where a polyp had been removed from was examined, and there was no  residual or recurrent polyp. As the scope was withdrawn, rest of the colonic  mucosa was carefully examined was  normal. Rectal mucosa was normal. Scope  was retroflexed to examine anorectal junction, and small hemorrhoids noted  below the dentate line.   The patient tolerated the procedure well.   The patient was noted to have six topic beats, possibly PACs.   FINAL DIAGNOSES:  1.  Left colonic diverticulosis.  2.  No evidence of residual or recurrent polyps.  3.  Cecal lipoma.  4.  Small external hemorrhoids.  5.  Arrhythmia with a possible PACs.   PLAN:  1.  High-fiber diet.  2.  Fiber supplement 3 to 4 g daily.  3.  Yearly Hemoccults.  4.  Consider next exam in 5 years  5.  Will obtain 12-lead EKG before she leaves the facility.      Lionel December, M.D.  Electronically Signed     NR/MEDQ  D:  08/23/2005  T:  08/23/2005  Job:  [id number]

## 2010-11-25 NOTE — Op Note (Signed)
NAMECATHYANN, [name]             ACCOUNT NO.:  [id number]   MEDICAL RECORD NO.:  [id number]          PATIENT TYPE:  AMB   LOCATION:  DAY                           FACILITY:  [facility]   PHYSICIAN:  Lionel December, M.D.    DATE OF BIRTH:  Aug 14, 1927   DATE OF PROCEDURE:  04/05/2004  DATE OF DISCHARGE:                                 OPERATIVE REPORT   PROCEDURE:  Total colonoscopy, polypectomy.   INDICATIONS:  [name] is a 77 year old Caucasian female who is here for a  screening colonoscopy.  Family history is negative for colorectal carcinoma.  Personal history is positive for uterine cancer, for which she had total  abdominal hysterectomy followed by chemotherapy and radiation therapy and  remains in remission.  She tells me that she has had nonbloody diarrhea this  summer.  The procedure was reviewed with the patient and informed consent  was obtained.   PREMEDICATION:  Demerol 50 mg IV, Versed 5 mg IV in divided dose.   FINDINGS:  Procedure performed in endoscopy suite.  The patient's vital  signs and O2 saturation were monitored during procedure and remained stable.  The patient was placed in the left lateral recumbent position and rectal  examination performed.  No abnormality noted on external or digital exam.  The Olympus video scope was placed in the rectum and advanced under vision  into sigmoid colon, where multiple small diverticula were noted.  Diverticula were also noted in the descending colon.  Preparation was felt  to be satisfactory.  The scope was passed to the cecum, which was identified  by ileocecal valve and appendiceal orifice.  There was about a centimeter-  sized submucosal lesion close to the appendiceal orifice, felt to be a  lipoma.  It had yellowish tinge to it.  Pictures taken for the record.  There was a 10 mm sessile polyp across from the ileocecal valve, which was  snared piecemeal.  Polypectomy was felt to be complete.  Most of the pieces  of this  polyp were retrieved for histologic examination.  As the scope was  withdrawn, mucosa of the rest of the colon was once again carefully  examined, and there were no more polyps noted.  Rectal mucosa was noted.  The scope was retroflexed to examine anorectal junction, and small  hemorrhoids were noted below the dentate line.  The endoscope was  straightened and withdrawn.  The patient tolerated the procedure well.   FINAL DIAGNOSES:  1.  Left colonic diverticulosis.  2.  A 1 cm sessile polyp snared from the cecum.  3.  Small submucosal cecal lipoma.  4.  Small external hemorrhoids.   RECOMMENDATIONS:  1.  Standard instructions given.  2.  Citrucel one tablespoonful daily.   I will be contacting the patient with biopsy results and further  recommendations.     Naje   NR/MEDQ  D:  04/05/2004  T:  04/05/2004  Job:  [id number]   cc:   Almetta Lovely  [address]  [place]  [place] [address]  Fax: [phone]   Melvyn Novas, MD  [address]  [place]  [place] [address]  Fax: [phone]

## 2010-11-25 NOTE — Procedures (Signed)
NAMEPETER, [name] NO.:  [id number]   MEDICAL RECORD NO.:  [id number]          PATIENT TYPE:  OUT   LOCATION:  RAD                           FACILITY:  [facility]   PHYSICIAN:  Jonelle Sidle, M.D. LHCDATE OF BIRTH:  1927/08/25   DATE OF PROCEDURE:  09/06/2005  DATE OF DISCHARGE:                                  ECHOCARDIOGRAM   REQUESTING PHYSICIAN:  Dr. Oval Linsey   INDICATION:  Palpitations.   TWO-DIMENSIONAL ECHOCARDIOGRAPH AND DOPPLER FINDINGS:  This is a technically  adequate study.   1.  There is mild left ventricular hypertrophy with diastolic septal and      posterior wall diameters of 12 and 10 mm, respectively.  Left      ventricular chamber size is normal with __________  dimension of 37 mm.      Overall left ventricular __________  is in the range of 50-55%.  2.  Mild mitral annular calcification with trace mitral regurgitation.  3.  Mild left atrial enlargement with a standard dimension of 42 mm.  4.  Normal aortic root size measuring 25 mm.  5.  The aortic valve is mildly thickened and trileaflet with grossly      preserved cuspid excursion.  No significant aortic regurgitation.  6.  Gross normal right ventricular chamber size __________  .  7.  Mild tricuspid regurgitation with a peak tricuspid regurgitant velocity      of 2.5 mm/sec suggesting normal pulmonary artery systolic pressure.  8.  Normal right atrial chamber size.  9.  Minimal pericardial effusion.   CONCLUSIONS:  1.  Mild left ventricular hypertrophy with normal chamber size and an      ejection fraction of 50-55%.  2.  Mild mitral annular calcification with trace mitral regurgitation and      mild left atrial enlargement.  3.  Aortic valve sclerosis without stenosis.  4.  Mild tricuspid regurgitation without significant evidence of pulmonary      hypertension.  5.  Minimal pericardial effusion.           ______________________________  Jonelle Sidle, M.D.  [facility]     SGM/MEDQ  D:  09/06/2005  T:  09/07/2005  Job:  [id number]   cc:   Melvyn Novas, MD  Fax: [phone]

## 2010-12-01 ENCOUNTER — Ambulatory Visit (INDEPENDENT_AMBULATORY_CARE_PROVIDER_SITE_OTHER): Payer: Medicare Other | Admitting: *Deleted

## 2010-12-01 DIAGNOSIS — I4891 Unspecified atrial fibrillation: Secondary | ICD-10-CM

## 2010-12-01 DIAGNOSIS — Z7901 Long term (current) use of anticoagulants: Secondary | ICD-10-CM

## 2010-12-22 ENCOUNTER — Ambulatory Visit (INDEPENDENT_AMBULATORY_CARE_PROVIDER_SITE_OTHER): Payer: Medicare Other | Admitting: *Deleted

## 2010-12-22 DIAGNOSIS — I4891 Unspecified atrial fibrillation: Secondary | ICD-10-CM

## 2010-12-22 DIAGNOSIS — Z7901 Long term (current) use of anticoagulants: Secondary | ICD-10-CM

## 2010-12-22 LAB — POCT INR: INR: 2.4

## 2011-01-12 ENCOUNTER — Ambulatory Visit (INDEPENDENT_AMBULATORY_CARE_PROVIDER_SITE_OTHER): Payer: Medicare Other | Admitting: *Deleted

## 2011-01-12 DIAGNOSIS — I4891 Unspecified atrial fibrillation: Secondary | ICD-10-CM

## 2011-01-12 DIAGNOSIS — Z7901 Long term (current) use of anticoagulants: Secondary | ICD-10-CM

## 2011-02-02 ENCOUNTER — Ambulatory Visit (INDEPENDENT_AMBULATORY_CARE_PROVIDER_SITE_OTHER): Payer: Medicare Other | Admitting: *Deleted

## 2011-02-02 DIAGNOSIS — I4891 Unspecified atrial fibrillation: Secondary | ICD-10-CM

## 2011-02-02 DIAGNOSIS — Z7901 Long term (current) use of anticoagulants: Secondary | ICD-10-CM

## 2011-02-16 ENCOUNTER — Ambulatory Visit (INDEPENDENT_AMBULATORY_CARE_PROVIDER_SITE_OTHER): Payer: Medicare Other | Admitting: *Deleted

## 2011-02-16 DIAGNOSIS — Z7901 Long term (current) use of anticoagulants: Secondary | ICD-10-CM

## 2011-02-16 DIAGNOSIS — I4891 Unspecified atrial fibrillation: Secondary | ICD-10-CM

## 2011-02-28 ENCOUNTER — Other Ambulatory Visit: Payer: Self-pay | Admitting: Cardiology

## 2011-03-09 ENCOUNTER — Encounter (INDEPENDENT_AMBULATORY_CARE_PROVIDER_SITE_OTHER): Payer: Medicare Other | Admitting: Ophthalmology

## 2011-03-09 DIAGNOSIS — H43819 Vitreous degeneration, unspecified eye: Secondary | ICD-10-CM

## 2011-03-09 DIAGNOSIS — H2510 Age-related nuclear cataract, unspecified eye: Secondary | ICD-10-CM

## 2011-03-09 DIAGNOSIS — H4420 Degenerative myopia, unspecified eye: Secondary | ICD-10-CM

## 2011-03-09 DIAGNOSIS — H33309 Unspecified retinal break, unspecified eye: Secondary | ICD-10-CM

## 2011-03-16 ENCOUNTER — Ambulatory Visit (INDEPENDENT_AMBULATORY_CARE_PROVIDER_SITE_OTHER): Payer: Medicare Other | Admitting: *Deleted

## 2011-03-16 DIAGNOSIS — Z7901 Long term (current) use of anticoagulants: Secondary | ICD-10-CM

## 2011-03-16 DIAGNOSIS — I4891 Unspecified atrial fibrillation: Secondary | ICD-10-CM

## 2011-03-16 LAB — POCT INR: INR: 1.6

## 2011-03-17 ENCOUNTER — Encounter (INDEPENDENT_AMBULATORY_CARE_PROVIDER_SITE_OTHER): Payer: Medicare Other | Admitting: Ophthalmology

## 2011-03-17 DIAGNOSIS — H4420 Degenerative myopia, unspecified eye: Secondary | ICD-10-CM

## 2011-03-17 DIAGNOSIS — H33309 Unspecified retinal break, unspecified eye: Secondary | ICD-10-CM

## 2011-03-17 DIAGNOSIS — Z79899 Other long term (current) drug therapy: Secondary | ICD-10-CM

## 2011-04-05 ENCOUNTER — Ambulatory Visit (INDEPENDENT_AMBULATORY_CARE_PROVIDER_SITE_OTHER): Payer: Medicare Other | Admitting: *Deleted

## 2011-04-05 DIAGNOSIS — I4891 Unspecified atrial fibrillation: Secondary | ICD-10-CM

## 2011-04-05 DIAGNOSIS — Z7901 Long term (current) use of anticoagulants: Secondary | ICD-10-CM

## 2011-04-05 LAB — POCT INR: INR: 2

## 2011-04-06 ENCOUNTER — Encounter: Payer: Medicare Other | Admitting: *Deleted

## 2011-04-22 ENCOUNTER — Other Ambulatory Visit: Payer: Self-pay | Admitting: Cardiology

## 2011-04-24 ENCOUNTER — Other Ambulatory Visit: Payer: Self-pay | Admitting: Adult Health

## 2011-04-26 ENCOUNTER — Ambulatory Visit (INDEPENDENT_AMBULATORY_CARE_PROVIDER_SITE_OTHER): Payer: Medicare Other | Admitting: *Deleted

## 2011-04-26 DIAGNOSIS — I4891 Unspecified atrial fibrillation: Secondary | ICD-10-CM

## 2011-04-26 DIAGNOSIS — Z7901 Long term (current) use of anticoagulants: Secondary | ICD-10-CM

## 2011-05-08 ENCOUNTER — Encounter: Payer: Medicare Other | Admitting: *Deleted

## 2011-05-10 ENCOUNTER — Ambulatory Visit (INDEPENDENT_AMBULATORY_CARE_PROVIDER_SITE_OTHER): Payer: Medicare Other | Admitting: *Deleted

## 2011-05-10 DIAGNOSIS — I4891 Unspecified atrial fibrillation: Secondary | ICD-10-CM

## 2011-05-10 DIAGNOSIS — Z7901 Long term (current) use of anticoagulants: Secondary | ICD-10-CM

## 2011-05-31 ENCOUNTER — Ambulatory Visit (INDEPENDENT_AMBULATORY_CARE_PROVIDER_SITE_OTHER): Payer: Medicare Other | Admitting: *Deleted

## 2011-05-31 DIAGNOSIS — Z7901 Long term (current) use of anticoagulants: Secondary | ICD-10-CM

## 2011-05-31 DIAGNOSIS — I4891 Unspecified atrial fibrillation: Secondary | ICD-10-CM

## 2011-06-21 ENCOUNTER — Ambulatory Visit (INDEPENDENT_AMBULATORY_CARE_PROVIDER_SITE_OTHER): Payer: Medicare Other | Admitting: *Deleted

## 2011-06-21 DIAGNOSIS — Z7901 Long term (current) use of anticoagulants: Secondary | ICD-10-CM

## 2011-06-21 DIAGNOSIS — I4891 Unspecified atrial fibrillation: Secondary | ICD-10-CM

## 2011-07-06 ENCOUNTER — Ambulatory Visit (INDEPENDENT_AMBULATORY_CARE_PROVIDER_SITE_OTHER): Payer: Medicare Other | Admitting: *Deleted

## 2011-07-06 DIAGNOSIS — I4891 Unspecified atrial fibrillation: Secondary | ICD-10-CM

## 2011-07-06 DIAGNOSIS — Z7901 Long term (current) use of anticoagulants: Secondary | ICD-10-CM

## 2011-07-12 ENCOUNTER — Ambulatory Visit (INDEPENDENT_AMBULATORY_CARE_PROVIDER_SITE_OTHER): Payer: Medicare Other | Admitting: *Deleted

## 2011-07-12 DIAGNOSIS — I4891 Unspecified atrial fibrillation: Secondary | ICD-10-CM

## 2011-07-12 DIAGNOSIS — Z7901 Long term (current) use of anticoagulants: Secondary | ICD-10-CM

## 2011-07-12 LAB — POCT INR: INR: 1.9

## 2011-07-17 ENCOUNTER — Ambulatory Visit: Payer: Medicare Other | Admitting: Cardiology

## 2011-07-17 ENCOUNTER — Encounter: Payer: Self-pay | Admitting: Cardiology

## 2011-07-17 DIAGNOSIS — C55 Malignant neoplasm of uterus, part unspecified: Secondary | ICD-10-CM | POA: Insufficient documentation

## 2011-07-17 DIAGNOSIS — K635 Polyp of colon: Secondary | ICD-10-CM | POA: Insufficient documentation

## 2011-07-17 DIAGNOSIS — K922 Gastrointestinal hemorrhage, unspecified: Secondary | ICD-10-CM | POA: Insufficient documentation

## 2011-07-17 DIAGNOSIS — F419 Anxiety disorder, unspecified: Secondary | ICD-10-CM | POA: Insufficient documentation

## 2011-07-17 DIAGNOSIS — F329 Major depressive disorder, single episode, unspecified: Secondary | ICD-10-CM

## 2011-07-21 ENCOUNTER — Encounter (INDEPENDENT_AMBULATORY_CARE_PROVIDER_SITE_OTHER): Payer: Medicare Other | Admitting: Ophthalmology

## 2011-07-31 ENCOUNTER — Ambulatory Visit (INDEPENDENT_AMBULATORY_CARE_PROVIDER_SITE_OTHER): Payer: Medicare Other | Admitting: *Deleted

## 2011-07-31 DIAGNOSIS — I4891 Unspecified atrial fibrillation: Secondary | ICD-10-CM

## 2011-07-31 DIAGNOSIS — Z7901 Long term (current) use of anticoagulants: Secondary | ICD-10-CM

## 2011-08-17 ENCOUNTER — Ambulatory Visit (INDEPENDENT_AMBULATORY_CARE_PROVIDER_SITE_OTHER): Payer: Medicare Other | Admitting: *Deleted

## 2011-08-17 ENCOUNTER — Encounter: Payer: Medicare Other | Admitting: *Deleted

## 2011-08-17 DIAGNOSIS — I4891 Unspecified atrial fibrillation: Secondary | ICD-10-CM

## 2011-08-17 DIAGNOSIS — Z7901 Long term (current) use of anticoagulants: Secondary | ICD-10-CM

## 2011-09-07 ENCOUNTER — Ambulatory Visit (INDEPENDENT_AMBULATORY_CARE_PROVIDER_SITE_OTHER): Payer: Medicare Other | Admitting: *Deleted

## 2011-09-07 ENCOUNTER — Ambulatory Visit: Payer: Medicare Other | Admitting: Adult Health

## 2011-09-07 DIAGNOSIS — I4891 Unspecified atrial fibrillation: Secondary | ICD-10-CM

## 2011-09-07 DIAGNOSIS — Z7901 Long term (current) use of anticoagulants: Secondary | ICD-10-CM

## 2011-09-07 LAB — POCT INR: INR: 1.1

## 2011-09-14 ENCOUNTER — Ambulatory Visit (INDEPENDENT_AMBULATORY_CARE_PROVIDER_SITE_OTHER): Payer: Medicare Other | Admitting: *Deleted

## 2011-09-14 DIAGNOSIS — I4891 Unspecified atrial fibrillation: Secondary | ICD-10-CM

## 2011-09-14 DIAGNOSIS — Z7901 Long term (current) use of anticoagulants: Secondary | ICD-10-CM

## 2011-09-15 ENCOUNTER — Ambulatory Visit: Payer: Medicare Other | Admitting: Adult Health

## 2011-09-26 ENCOUNTER — Ambulatory Visit: Payer: Medicare Other | Admitting: Adult Health

## 2011-10-05 ENCOUNTER — Encounter: Payer: Self-pay | Admitting: Adult Health

## 2011-10-05 ENCOUNTER — Ambulatory Visit (INDEPENDENT_AMBULATORY_CARE_PROVIDER_SITE_OTHER): Payer: Medicare Other | Admitting: Adult Health

## 2011-10-05 ENCOUNTER — Ambulatory Visit (INDEPENDENT_AMBULATORY_CARE_PROVIDER_SITE_OTHER): Payer: Medicare Other | Admitting: *Deleted

## 2011-10-05 VITALS — BP 106/74 | HR 107 | Ht 63.0 in | Wt 142.0 lb

## 2011-10-05 DIAGNOSIS — I4891 Unspecified atrial fibrillation: Secondary | ICD-10-CM

## 2011-10-05 DIAGNOSIS — Z7901 Long term (current) use of anticoagulants: Secondary | ICD-10-CM

## 2011-10-05 NOTE — Assessment & Plan Note (Signed)
She is doing well. Tolerating medications without complaints. She will need to have labs completed as she has not seen her PCP in greater than a year as well. CBC, BMET and echo will be ordered to have continued assessment of her renal status, check for anemia on coumadin, and for LV fx with long term atrial fib.  She will return in 6 months if she is willing.

## 2011-10-05 NOTE — Progress Notes (Signed)
   HPI: Mr.s Tennyson is a 78 y/o patient of Dr.Rothbart we are seeing for ongoing assessment and treatment of permanent atrial fib, followed in our coumadin clinic, but not seen in cardiology clinic in 18 months. "I don't like seeing a doctor." She has a history of early dementia and has a CNA with her who makes sure she takes her medications and brings her to appointments. She is without complaint today with the exception of occasional LE for which she takes lasix. Usually once or twice a month. She denies dizziness or chest pain.   No Known Allergies  Current Outpatient Prescriptions  Medication Sig Dispense Refill  . furosemide (LASIX) 20 MG tablet TAKE 1 TABLET DAILY FOR 2DAYS THEN AS NEEDED FORSWELLING.  30 tablet  6  . metoprolol (TOPROL-XL) 50 MG 24 hr tablet TAKE ONE TABLET DAILY.  30 tablet  5  . warfarin (COUMADIN) 2.5 MG tablet TAKE AS DIRECTED BY      ANTICOAGULATION CLINIC.  60 tablet  3    Past Medical History  Diagnosis Date  . Upper GI bleed     secondary to gastritis-2008; gastric polyps also noted.  . Diverticulosis   . Atrial fibrillation   . Chronic anticoagulation     Normal echo and TSH in 2007  . Tobacco abuse, in remission     10-pack-years; remote  . Anxiety and depression   . Macular degeneration   . 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 Uterine cancer 2002    surgery, radiation therapy and chemotherapy    Past Surgical History  Procedure Date  . Appendectomy 2002  . Cholecystectomy 1990's  . Total abdominal hysterectomy w/ bilateral salpingoophorectomy 2002    + lymph node dissection; 4 uterine carcinoma  . Colonoscopy 2005; 08/2005    2005-polypectomy with microscopic foci of carcinoma; 2007-no recurrence of polyps    ZOX:WRUEAV of systems complete and found to be negative unless listed above PHYSICAL EXAM BP 106/74  Pulse 107  Ht 5\' 3"  (1.6 m)  Wt 142 lb (64.411 kg)  BMI 25.15 kg/m2  General: Well developed, well  nourished, in no acute distress Head: Eyes PERRLA, No xanthomas.   Normal cephalic and atramatic  Lungs: Clear bilaterally to auscultation and percussion. Heart: HRIR without MRG.  Pulses are 2+ & equal.            No carotid bruit. No JVD.  No abdominal bruits. No femoral bruits. Abdomen: Bowel sounds are positive, abdomen soft and non-tender without masses or                  Hernia's noted. Msk:  Back normal, normal gait. Normal strength and tone for age. Extremities: No clubbing, cyanosis or edema.  DP +1 Neuro: Alert and oriented X 3. Psych:  Good affect, responds appropriately  WUJ:WJXBJY fib/flutter rate of 90 bpm.  ASSESSMENT AND PLAN

## 2011-10-05 NOTE — Patient Instructions (Signed)
Your physician recommends that you schedule a follow-up appointment in: 6 MONTHS  Your physician recommends that you return for lab work in: TODAY  Your physician has requested that you have an echocardiogram. Echocardiography is a painless test that uses sound waves to create images of your heart. It provides your doctor with information about the size and shape of your heart and how well your heart's chambers and valves are working. This procedure takes approximately one hour. There are no restrictions for this procedure.

## 2011-10-06 LAB — CBC
HCT: 43 % (ref 36.0–46.0)
Hemoglobin: 14 g/dL (ref 12.0–15.0)
MCV: 91.9 fL (ref 78.0–100.0)
RDW: 14 % (ref 11.5–15.5)
WBC: 5.5 10*3/uL (ref 4.0–10.5)

## 2011-10-06 LAB — COMPREHENSIVE METABOLIC PANEL
ALT: 12 U/L (ref 0–35)
AST: 20 U/L (ref 0–37)
Albumin: 4.4 g/dL (ref 3.5–5.2)
Alkaline Phosphatase: 86 U/L (ref 39–117)
Glucose, Bld: 85 mg/dL (ref 70–99)
Potassium: 4.6 mEq/L (ref 3.5–5.3)
Sodium: 140 mEq/L (ref 135–145)
Total Protein: 6.7 g/dL (ref 6.0–8.3)

## 2011-10-10 ENCOUNTER — Ambulatory Visit (HOSPITAL_COMMUNITY)
Admission: RE | Admit: 2011-10-10 | Discharge: 2011-10-10 | Disposition: A | Discharge status: Home / Self Care | Payer: Medicare Other | Source: Ambulatory Visit | Attending: Adult Health | Admitting: Adult Health

## 2011-10-10 ENCOUNTER — Other Ambulatory Visit: Payer: Self-pay | Admitting: Cardiology

## 2011-10-10 DIAGNOSIS — I369 Nonrheumatic tricuspid valve disorder, unspecified: Secondary | ICD-10-CM

## 2011-10-10 DIAGNOSIS — I4891 Unspecified atrial fibrillation: Secondary | ICD-10-CM | POA: Insufficient documentation

## 2011-10-10 MED ORDER — METOPROLOL SUCCINATE ER 50 MG PO TB24: 50.0000 mg | ORAL_TABLET | Freq: Every day | ORAL | Status: DC

## 2011-10-10 NOTE — Telephone Encounter (Signed)
PT HOME NURSE CAME IN TODAY AND GAVE ME A EMPTY PILL BOTTLE SHE WAS UNSURE WHAT HAD HAPPENED TO ALL THE PILLS BUT THEY NEED ANOTHER RX CALLED IN/TMJ

## 2011-10-10 NOTE — Progress Notes (Signed)
*  PRELIMINARY RESULTS* Echocardiogram 2D Echocardiogram has been performed.  [name] 10/10/2011, 10:41 AM

## 2011-10-16 ENCOUNTER — Ambulatory Visit (INDEPENDENT_AMBULATORY_CARE_PROVIDER_SITE_OTHER): Payer: Medicare Other | Admitting: *Deleted

## 2011-10-16 ENCOUNTER — Encounter: Payer: Self-pay | Admitting: *Deleted

## 2011-10-16 ENCOUNTER — Other Ambulatory Visit: Payer: Self-pay | Admitting: *Deleted

## 2011-10-16 DIAGNOSIS — I10 Essential (primary) hypertension: Secondary | ICD-10-CM

## 2011-10-16 DIAGNOSIS — Z7901 Long term (current) use of anticoagulants: Secondary | ICD-10-CM

## 2011-10-16 DIAGNOSIS — I4891 Unspecified atrial fibrillation: Secondary | ICD-10-CM

## 2011-10-26 ENCOUNTER — Ambulatory Visit (INDEPENDENT_AMBULATORY_CARE_PROVIDER_SITE_OTHER): Payer: Medicare Other | Admitting: *Deleted

## 2011-10-26 DIAGNOSIS — Z7901 Long term (current) use of anticoagulants: Secondary | ICD-10-CM

## 2011-10-26 DIAGNOSIS — I4891 Unspecified atrial fibrillation: Secondary | ICD-10-CM

## 2011-11-13 ENCOUNTER — Ambulatory Visit (INDEPENDENT_AMBULATORY_CARE_PROVIDER_SITE_OTHER): Payer: Medicare Other | Admitting: *Deleted

## 2011-11-13 DIAGNOSIS — Z7901 Long term (current) use of anticoagulants: Secondary | ICD-10-CM

## 2011-11-13 DIAGNOSIS — I4891 Unspecified atrial fibrillation: Secondary | ICD-10-CM

## 2011-11-13 LAB — POCT INR: INR: 3.3

## 2011-11-22 ENCOUNTER — Other Ambulatory Visit: Payer: Self-pay | Admitting: Cardiology

## 2011-11-23 ENCOUNTER — Other Ambulatory Visit: Payer: Self-pay | Admitting: *Deleted

## 2011-11-23 DIAGNOSIS — I10 Essential (primary) hypertension: Secondary | ICD-10-CM

## 2011-11-23 LAB — BASIC METABOLIC PANEL
CO2: 25 mEq/L (ref 19–32)
Chloride: 105 mEq/L (ref 96–112)
Creat: 1.32 mg/dL — ABNORMAL HIGH (ref 0.50–1.10)
Potassium: 4.1 mEq/L (ref 3.5–5.3)
Sodium: 141 mEq/L (ref 135–145)

## 2011-11-29 ENCOUNTER — Encounter: Payer: Self-pay | Admitting: *Deleted

## 2011-12-11 ENCOUNTER — Ambulatory Visit (INDEPENDENT_AMBULATORY_CARE_PROVIDER_SITE_OTHER): Payer: Medicare Other | Admitting: *Deleted

## 2011-12-11 DIAGNOSIS — Z7901 Long term (current) use of anticoagulants: Secondary | ICD-10-CM

## 2011-12-11 DIAGNOSIS — I4891 Unspecified atrial fibrillation: Secondary | ICD-10-CM

## 2012-01-01 ENCOUNTER — Ambulatory Visit (INDEPENDENT_AMBULATORY_CARE_PROVIDER_SITE_OTHER): Payer: Medicare Other | Admitting: *Deleted

## 2012-01-01 DIAGNOSIS — Z7901 Long term (current) use of anticoagulants: Secondary | ICD-10-CM

## 2012-01-01 DIAGNOSIS — I4891 Unspecified atrial fibrillation: Secondary | ICD-10-CM

## 2012-01-17 ENCOUNTER — Ambulatory Visit (INDEPENDENT_AMBULATORY_CARE_PROVIDER_SITE_OTHER): Payer: Medicare Other | Admitting: *Deleted

## 2012-01-17 DIAGNOSIS — I4891 Unspecified atrial fibrillation: Secondary | ICD-10-CM

## 2012-01-17 DIAGNOSIS — Z7901 Long term (current) use of anticoagulants: Secondary | ICD-10-CM

## 2012-01-31 ENCOUNTER — Other Ambulatory Visit: Payer: Self-pay | Admitting: Cardiology

## 2012-02-12 ENCOUNTER — Ambulatory Visit (INDEPENDENT_AMBULATORY_CARE_PROVIDER_SITE_OTHER): Payer: Medicare Other | Admitting: *Deleted

## 2012-02-12 DIAGNOSIS — I4891 Unspecified atrial fibrillation: Secondary | ICD-10-CM

## 2012-02-12 DIAGNOSIS — Z7901 Long term (current) use of anticoagulants: Secondary | ICD-10-CM

## 2012-02-12 LAB — POCT INR: INR: 2

## 2012-03-07 ENCOUNTER — Ambulatory Visit (INDEPENDENT_AMBULATORY_CARE_PROVIDER_SITE_OTHER): Payer: Medicare Other | Admitting: *Deleted

## 2012-03-07 DIAGNOSIS — I4891 Unspecified atrial fibrillation: Secondary | ICD-10-CM

## 2012-03-07 DIAGNOSIS — Z7901 Long term (current) use of anticoagulants: Secondary | ICD-10-CM

## 2012-03-07 LAB — POCT INR: INR: 1.9

## 2012-03-07 NOTE — Patient Instructions (Signed)
Always look to see if there is any blood in your urine or stool, it can be bright red or in stool sometimes dark back tarry looking, notify your doctor

## 2012-03-21 ENCOUNTER — Ambulatory Visit (INDEPENDENT_AMBULATORY_CARE_PROVIDER_SITE_OTHER): Payer: Medicare Other | Admitting: *Deleted

## 2012-03-21 DIAGNOSIS — Z7901 Long term (current) use of anticoagulants: Secondary | ICD-10-CM

## 2012-03-21 DIAGNOSIS — I4891 Unspecified atrial fibrillation: Secondary | ICD-10-CM

## 2012-04-02 ENCOUNTER — Ambulatory Visit: Payer: Medicare Other | Admitting: Cardiology

## 2012-04-02 ENCOUNTER — Encounter: Payer: Self-pay | Admitting: Cardiology

## 2012-04-18 ENCOUNTER — Other Ambulatory Visit: Payer: Self-pay | Admitting: Adult Health

## 2012-05-01 ENCOUNTER — Encounter: Payer: Self-pay | Admitting: *Deleted

## 2012-05-02 ENCOUNTER — Other Ambulatory Visit: Payer: Self-pay | Admitting: Adult Health

## 2012-05-06 ENCOUNTER — Ambulatory Visit (INDEPENDENT_AMBULATORY_CARE_PROVIDER_SITE_OTHER): Payer: Medicare Other | Admitting: *Deleted

## 2012-05-06 DIAGNOSIS — Z7901 Long term (current) use of anticoagulants: Secondary | ICD-10-CM

## 2012-05-06 DIAGNOSIS — I4891 Unspecified atrial fibrillation: Secondary | ICD-10-CM

## 2012-05-06 LAB — POCT INR: INR: 3.4

## 2012-05-27 ENCOUNTER — Ambulatory Visit (INDEPENDENT_AMBULATORY_CARE_PROVIDER_SITE_OTHER): Payer: Medicare Other | Admitting: *Deleted

## 2012-05-27 DIAGNOSIS — I4891 Unspecified atrial fibrillation: Secondary | ICD-10-CM

## 2012-05-27 DIAGNOSIS — Z7901 Long term (current) use of anticoagulants: Secondary | ICD-10-CM

## 2012-05-27 LAB — POCT INR: INR: 2.8

## 2012-06-17 ENCOUNTER — Ambulatory Visit (INDEPENDENT_AMBULATORY_CARE_PROVIDER_SITE_OTHER): Payer: Medicare Other | Admitting: *Deleted

## 2012-06-17 DIAGNOSIS — Z7901 Long term (current) use of anticoagulants: Secondary | ICD-10-CM

## 2012-06-17 DIAGNOSIS — I4891 Unspecified atrial fibrillation: Secondary | ICD-10-CM

## 2012-06-17 LAB — POCT INR: INR: 3.5

## 2012-07-01 ENCOUNTER — Other Ambulatory Visit: Payer: Self-pay | Admitting: Cardiology

## 2012-07-11 ENCOUNTER — Telehealth: Payer: Self-pay | Admitting: Cardiology

## 2012-07-11 NOTE — Telephone Encounter (Signed)
Patient no showed appointment.  Rescheduled with patient to 07/17/2012. / tgs

## 2012-07-24 ENCOUNTER — Ambulatory Visit (INDEPENDENT_AMBULATORY_CARE_PROVIDER_SITE_OTHER): Payer: Medicare Other | Admitting: *Deleted

## 2012-07-24 DIAGNOSIS — I4891 Unspecified atrial fibrillation: Secondary | ICD-10-CM

## 2012-07-24 DIAGNOSIS — Z7901 Long term (current) use of anticoagulants: Secondary | ICD-10-CM

## 2012-08-21 ENCOUNTER — Ambulatory Visit (INDEPENDENT_AMBULATORY_CARE_PROVIDER_SITE_OTHER): Payer: Medicare Other | Admitting: *Deleted

## 2012-08-21 DIAGNOSIS — Z7901 Long term (current) use of anticoagulants: Secondary | ICD-10-CM

## 2012-08-21 DIAGNOSIS — I4891 Unspecified atrial fibrillation: Secondary | ICD-10-CM

## 2012-09-18 ENCOUNTER — Ambulatory Visit (INDEPENDENT_AMBULATORY_CARE_PROVIDER_SITE_OTHER): Payer: Medicare Other | Admitting: *Deleted

## 2012-09-18 DIAGNOSIS — Z7901 Long term (current) use of anticoagulants: Secondary | ICD-10-CM

## 2012-09-18 DIAGNOSIS — I4891 Unspecified atrial fibrillation: Secondary | ICD-10-CM

## 2012-10-09 ENCOUNTER — Encounter: Payer: Self-pay | Admitting: Cardiology

## 2012-10-09 ENCOUNTER — Ambulatory Visit (INDEPENDENT_AMBULATORY_CARE_PROVIDER_SITE_OTHER): Payer: Medicare Other | Admitting: Cardiology

## 2012-10-09 ENCOUNTER — Ambulatory Visit (INDEPENDENT_AMBULATORY_CARE_PROVIDER_SITE_OTHER): Payer: Medicare Other | Admitting: *Deleted

## 2012-10-09 VITALS — BP 97/66 | HR 86 | Ht 64.0 in | Wt 166.2 lb

## 2012-10-09 DIAGNOSIS — F068 Other specified mental disorders due to known physiological condition: Secondary | ICD-10-CM

## 2012-10-09 DIAGNOSIS — H3530 Unspecified macular degeneration: Secondary | ICD-10-CM

## 2012-10-09 DIAGNOSIS — I4891 Unspecified atrial fibrillation: Secondary | ICD-10-CM

## 2012-10-09 DIAGNOSIS — D126 Benign neoplasm of colon, unspecified: Secondary | ICD-10-CM

## 2012-10-09 DIAGNOSIS — Z7901 Long term (current) use of anticoagulants: Secondary | ICD-10-CM

## 2012-10-09 DIAGNOSIS — K635 Polyp of colon: Secondary | ICD-10-CM

## 2012-10-09 LAB — POCT INR: INR: 2.6

## 2012-10-09 NOTE — Assessment & Plan Note (Signed)
Memory is clearly impaired, but functional status is relatively good. She continues to drive short distances and to live alone.

## 2012-10-09 NOTE — Assessment & Plan Note (Addendum)
Due to patient's advanced age and medical problems, additional screening colonoscopies are probably not warranted.

## 2012-10-09 NOTE — Progress Notes (Signed)
Patient ID: [name], female   DOB: 1927/10/12, 79 y.o.   MRN: [id number]  HPI: Schedule return visit for this very nice older woman with atrial fibrillation requiring chronic anticoagulation. She has done very well with warfarin with stable and therapeutic INRs and no adverse effects. She reports no new medical issues. She is accompanied by a younger sister, who provides most of her medical history.  Intermittent pedal edema, mostly of the left leg, has been treated with compression stockings and diuretics. 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  Current Outpatient Prescriptions  Medication Sig Dispense Refill  . COUMADIN 2.5 MG tablet TAKE AS DIRECTED BY ANTICOAGULATION CLINIC.  60 tablet  0  . furosemide (LASIX) 20 MG tablet Take 1 tablet (20 mg total) by mouth as needed.  30 tablet  2  . TOPROL XL 50 MG 24 hr tablet TAKE 1 TABLET BY MOUTH ONCE DAILY, WITH OR IMMEDIATELY AFTER A MEAL.  30 tablet  11   No current facility-administered medications for this visit.   No Known Allergies    Past medical history, social history, and family history reviewed and updated.  ROS: Denies dyspnea, chest discomfort, orthopnea, PND, palpitations, lightheadedness or syncope. She had her last colonoscopy in 2007 and does not intend to undergo any further testing. All other systems reviewed and are negative.  PHYSICAL EXAM: BP 97/66  Pulse 86  Ht 5\' 4"  (1.626 m)  Wt 75.411 kg (166 lb 4 oz)  BMI 28.52 kg/m2;  Body mass index is 28.52 kg/(m^2). General-Well developed; no acute distress Body habitus-mildly overweight Neck-No JVD; no carotid bruits Lungs-clear lung fields; resonant to percussion Cardiovascular-normal PMI; normal S1 and S2; irregular Abdomen-normal bowel sounds; soft and non-tender without masses or organomegaly Musculoskeletal-No deformities, no cyanosis or clubbing Neurologic-Normal cranial nerves; symmetric strength and  tone Skin-Warm, no significant lesions Extremities-distal pulses intact; no edema on the right; 1/2+ left ankle edema  EKG: Atrial fibrillation with a controlled ventricular response; minor nonspecific ST-T wave abnormalities. No previous tracing for comparison.   Bing, MD 10/09/2012  3:06 PM  ASSESSMENT AND PLAN

## 2012-10-09 NOTE — Assessment & Plan Note (Addendum)
Novel oral anticoagulant is discussed. Patient prefers to continue treatment with warfarin. We will monitor for occult GI blood loss. Stool cards for FOBT provided.

## 2012-10-09 NOTE — Progress Notes (Deleted)
Name: [name]    DOB: 1927-11-01  Age: 79 y.o.  MR#: [id number]       PCP:  Dwana Melena, MD      Insurance: Payor: MEDICARE  Plan: MEDICARE PART A AND B  Product Type: *No Product type*    CC:   Echo 10-10-11 list  VS Filed Vitals:   10/09/12 1420  BP: 97/66  Pulse: 86  Height: 5\' 4"  (1.626 m)  Weight: 166 lb 4 oz (75.411 kg)    Weights Current Weight  10/09/12 166 lb 4 oz (75.411 kg)  10/05/11 142 lb (64.411 kg)  03/11/10 144 lb (65.318 kg)    Blood Pressure  BP Readings from Last 3 Encounters:  10/09/12 97/66  10/05/11 106/74  03/11/10 127/90     Admit date:  (Not on file) Last encounter with RMR:  07/11/2012   Allergy Review of patient's allergies indicates no known allergies.  Current Outpatient Prescriptions  Medication Sig Dispense Refill  . COUMADIN 2.5 MG tablet TAKE AS DIRECTED BY ANTICOAGULATION CLINIC.  60 tablet  0  . furosemide (LASIX) 20 MG tablet Take 1 tablet (20 mg total) by mouth as needed.  30 tablet  2  . TOPROL XL 50 MG 24 hr tablet TAKE 1 TABLET BY MOUTH ONCE DAILY, WITH OR IMMEDIATELY AFTER A MEAL.  30 tablet  11   No current facility-administered medications for this visit.    Discontinued Meds:   There are no discontinued medications.  Patient Active Problem List  Diagnosis  . DEMENTIA  . MACULAR DEGENERATION  . Chronic anticoagulation  . Anxiety and depression  . Colonic polyp    LABS    Component Value Date/Time   NA 141 11/22/2011 1701   NA 140 10/05/2011 1542   NA 141 03/11/2010 2343   K 4.1 11/22/2011 1701   K 4.6 10/05/2011 1542   K 3.8 03/11/2010 2343   CL 105 11/22/2011 1701   CL 105 10/05/2011 1542   CL 104 03/11/2010 2343   CO2 25 11/22/2011 1701   CO2 28 10/05/2011 1542   CO2 27 03/11/2010 2343   GLUCOSE 86 11/22/2011 1701   GLUCOSE 85 10/05/2011 1542   GLUCOSE 90 03/11/2010 2343   BUN 19 11/22/2011 1701   BUN 31* 10/05/2011 1542   BUN 15 03/11/2010 2343   CREATININE 1.32* 11/22/2011 1701   CREATININE 1.39* 10/05/2011 1542    CREATININE 1.10 03/11/2010 2343   CREATININE one 09/07/2005   CALCIUM 9.4 11/22/2011 1701   CALCIUM 9.5 10/05/2011 1542   CALCIUM 9.8 03/11/2010 2343   CMP     Component Value Date/Time   NA 141 11/22/2011 1701   K 4.1 11/22/2011 1701   CL 105 11/22/2011 1701   CO2 25 11/22/2011 1701   GLUCOSE 86 11/22/2011 1701   BUN 19 11/22/2011 1701   CREATININE 1.32* 11/22/2011 1701   CREATININE 1.10 03/11/2010 2343   CALCIUM 9.4 11/22/2011 1701   PROT 6.7 10/05/2011 1542   ALBUMIN 4.4 10/05/2011 1542   AST 20 10/05/2011 1542   ALT 12 10/05/2011 1542   ALKPHOS 86 10/05/2011 1542   BILITOT 0.6 10/05/2011 1542       Component Value Date/Time   WBC 5.5 10/05/2011 1542   WBC 5.4 03/11/2010 2343   WBC 5.3 06/10/2007   HGB 14.0 10/05/2011 1542   HGB 15.6* 03/11/2010 2343   HGB 11.3 06/17/2007 0942   HCT 43.0 10/05/2011 1542   HCT 47.0* 03/11/2010 2343   HCT  42.7 06/10/2007   MCV 91.9 10/05/2011 1542   MCV 91.1 03/11/2010 2343   MCV 88 06/10/2007    Lipid Panel     Component Value Date/Time   CHOL 161 03/11/2010 2343   TRIG 69 03/11/2010 2343   HDL 67 03/11/2010 2343   CHOLHDL 2.4 Ratio 03/11/2010 2343   VLDL 14 03/11/2010 2343   LDLCALC 80 03/11/2010 2343   LDLCALC 66 09/07/2005    ABG No results found for this basename: phart, pco2, pco2art, po2, po2art, hco3, tco2, acidbasedef, o2sat     Lab Results  Component Value Date   TSH 3.97 09/21/2005   BNP (last 3 results) No results found for this basename: PROBNP,  in the last 8760 hours Cardiac Panel (last 3 results) No results found for this basename: CKTOTAL, CKMB, TROPONINI, RELINDX,  in the last 72 hours  Iron/TIBC/Ferritin No results found for this basename: iron, tibc, ferritin     EKG Orders placed in visit on 10/09/12  . EKG 12-LEAD     Prior Assessment and Plan Problem List as of 10/09/2012     ICD-9-CM   DEMENTIA   MACULAR DEGENERATION   Chronic anticoagulation   Anxiety and depression   Colonic polyp       Imaging: No results  found.

## 2012-10-09 NOTE — Assessment & Plan Note (Signed)
Ventricular rate is well-controlled.  Anticoagulation is providing adequate prophylaxis for cardiac thromboembolism.

## 2012-10-09 NOTE — Patient Instructions (Addendum)
Your physician recommends that you schedule a follow-up appointment in: 1 year  Stools x 3  

## 2012-10-31 ENCOUNTER — Ambulatory Visit (HOSPITAL_COMMUNITY)
Admission: RE | Admit: 2012-10-31 | Discharge: 2012-10-31 | Disposition: A | Discharge status: Home / Self Care | Payer: Medicare Other | Source: Ambulatory Visit | Attending: Internal Medicine | Admitting: Internal Medicine

## 2012-10-31 ENCOUNTER — Other Ambulatory Visit (HOSPITAL_COMMUNITY): Payer: Self-pay | Admitting: Internal Medicine

## 2012-10-31 DIAGNOSIS — M545 Low back pain, unspecified: Secondary | ICD-10-CM | POA: Insufficient documentation

## 2012-11-06 ENCOUNTER — Ambulatory Visit (INDEPENDENT_AMBULATORY_CARE_PROVIDER_SITE_OTHER): Payer: Medicare Other | Admitting: *Deleted

## 2012-11-06 DIAGNOSIS — Z7901 Long term (current) use of anticoagulants: Secondary | ICD-10-CM

## 2012-11-06 DIAGNOSIS — I4891 Unspecified atrial fibrillation: Secondary | ICD-10-CM

## 2012-11-06 LAB — POCT INR: INR: 3.4

## 2012-11-12 ENCOUNTER — Encounter: Payer: Self-pay | Admitting: Cardiology

## 2012-11-15 ENCOUNTER — Other Ambulatory Visit: Payer: Self-pay | Admitting: Cardiology

## 2012-11-19 NOTE — Telephone Encounter (Signed)
Please refill.

## 2012-11-27 ENCOUNTER — Ambulatory Visit (INDEPENDENT_AMBULATORY_CARE_PROVIDER_SITE_OTHER): Payer: Medicare Other | Admitting: *Deleted

## 2012-11-27 DIAGNOSIS — Z7901 Long term (current) use of anticoagulants: Secondary | ICD-10-CM

## 2012-11-27 DIAGNOSIS — I4891 Unspecified atrial fibrillation: Secondary | ICD-10-CM

## 2012-11-27 LAB — POCT INR: INR: 1.9

## 2012-12-18 ENCOUNTER — Ambulatory Visit (INDEPENDENT_AMBULATORY_CARE_PROVIDER_SITE_OTHER): Payer: Medicare Other | Admitting: *Deleted

## 2012-12-18 DIAGNOSIS — Z7901 Long term (current) use of anticoagulants: Secondary | ICD-10-CM

## 2012-12-18 DIAGNOSIS — I4891 Unspecified atrial fibrillation: Secondary | ICD-10-CM

## 2013-01-15 ENCOUNTER — Ambulatory Visit (INDEPENDENT_AMBULATORY_CARE_PROVIDER_SITE_OTHER): Payer: Medicare Other | Admitting: *Deleted

## 2013-01-15 DIAGNOSIS — I4891 Unspecified atrial fibrillation: Secondary | ICD-10-CM

## 2013-01-15 DIAGNOSIS — Z7901 Long term (current) use of anticoagulants: Secondary | ICD-10-CM

## 2013-01-29 ENCOUNTER — Ambulatory Visit (INDEPENDENT_AMBULATORY_CARE_PROVIDER_SITE_OTHER): Payer: Medicare Other | Admitting: *Deleted

## 2013-01-29 DIAGNOSIS — I4891 Unspecified atrial fibrillation: Secondary | ICD-10-CM

## 2013-01-29 DIAGNOSIS — Z7901 Long term (current) use of anticoagulants: Secondary | ICD-10-CM

## 2013-01-29 NOTE — Patient Instructions (Signed)
Eat 2-3 servings of leafy green vegetables weekly, consistency, written information provided

## 2013-02-20 ENCOUNTER — Ambulatory Visit (INDEPENDENT_AMBULATORY_CARE_PROVIDER_SITE_OTHER): Payer: Medicare Other | Admitting: *Deleted

## 2013-02-20 ENCOUNTER — Encounter (INDEPENDENT_AMBULATORY_CARE_PROVIDER_SITE_OTHER): Payer: Medicare Other | Admitting: *Deleted

## 2013-02-20 DIAGNOSIS — I4891 Unspecified atrial fibrillation: Secondary | ICD-10-CM

## 2013-02-20 DIAGNOSIS — Z7901 Long term (current) use of anticoagulants: Secondary | ICD-10-CM

## 2013-02-20 LAB — POCT INR
INR: 1.7
INR: 1.7

## 2013-02-20 NOTE — Progress Notes (Signed)
This encounter was created in error - please disregard.  This encounter was created in error - please disregard.

## 2013-03-13 ENCOUNTER — Ambulatory Visit (INDEPENDENT_AMBULATORY_CARE_PROVIDER_SITE_OTHER): Payer: Medicare Other | Admitting: *Deleted

## 2013-03-13 DIAGNOSIS — I4891 Unspecified atrial fibrillation: Secondary | ICD-10-CM

## 2013-03-13 DIAGNOSIS — Z7901 Long term (current) use of anticoagulants: Secondary | ICD-10-CM

## 2013-03-13 LAB — POCT INR: INR: 3

## 2013-03-27 ENCOUNTER — Ambulatory Visit (INDEPENDENT_AMBULATORY_CARE_PROVIDER_SITE_OTHER): Payer: Medicare Other | Admitting: *Deleted

## 2013-03-27 DIAGNOSIS — I4891 Unspecified atrial fibrillation: Secondary | ICD-10-CM

## 2013-03-27 DIAGNOSIS — Z7901 Long term (current) use of anticoagulants: Secondary | ICD-10-CM

## 2013-04-17 ENCOUNTER — Ambulatory Visit (INDEPENDENT_AMBULATORY_CARE_PROVIDER_SITE_OTHER): Payer: Medicare Other | Admitting: *Deleted

## 2013-04-17 DIAGNOSIS — Z7901 Long term (current) use of anticoagulants: Secondary | ICD-10-CM

## 2013-04-17 DIAGNOSIS — I4891 Unspecified atrial fibrillation: Secondary | ICD-10-CM

## 2013-04-21 ENCOUNTER — Other Ambulatory Visit: Payer: Self-pay | Admitting: Adult Health

## 2013-04-21 ENCOUNTER — Emergency Department (HOSPITAL_COMMUNITY)
Admission: EM | Admit: 2013-04-21 | Discharge: 2013-04-21 | Discharge status: Against Medical Advice | Payer: Medicare Other | Attending: Emergency Medicine | Admitting: Emergency Medicine

## 2013-04-21 ENCOUNTER — Encounter (HOSPITAL_COMMUNITY): Payer: Self-pay | Admitting: Emergency Medicine

## 2013-04-21 DIAGNOSIS — R259 Unspecified abnormal involuntary movements: Secondary | ICD-10-CM | POA: Insufficient documentation

## 2013-04-21 DIAGNOSIS — Z87891 Personal history of nicotine dependence: Secondary | ICD-10-CM | POA: Insufficient documentation

## 2013-04-21 HISTORY — DX: Anxiety disorder, unspecified: F41.9

## 2013-04-21 HISTORY — DX: Panic disorder (episodic paroxysmal anxiety): F41.0

## 2013-04-21 NOTE — ED Notes (Signed)
Pt's daughter reports that she began "shaking" (pt has tremors) at 1pm today. Pt reports having a panic attack, pt reports that she "went out of her mind". Was given her ativan, and stated it helped.

## 2013-05-15 ENCOUNTER — Ambulatory Visit (INDEPENDENT_AMBULATORY_CARE_PROVIDER_SITE_OTHER): Payer: Medicare Other | Admitting: *Deleted

## 2013-05-15 DIAGNOSIS — Z7901 Long term (current) use of anticoagulants: Secondary | ICD-10-CM

## 2013-05-15 DIAGNOSIS — I4891 Unspecified atrial fibrillation: Secondary | ICD-10-CM

## 2013-05-15 LAB — POCT INR: INR: 1.9

## 2013-06-12 ENCOUNTER — Ambulatory Visit (INDEPENDENT_AMBULATORY_CARE_PROVIDER_SITE_OTHER): Payer: Medicare Other | Admitting: *Deleted

## 2013-06-12 DIAGNOSIS — Z7901 Long term (current) use of anticoagulants: Secondary | ICD-10-CM

## 2013-06-12 DIAGNOSIS — I4891 Unspecified atrial fibrillation: Secondary | ICD-10-CM

## 2013-07-14 ENCOUNTER — Ambulatory Visit (INDEPENDENT_AMBULATORY_CARE_PROVIDER_SITE_OTHER): Payer: Medicare Other | Admitting: *Deleted

## 2013-07-14 DIAGNOSIS — I4891 Unspecified atrial fibrillation: Secondary | ICD-10-CM

## 2013-07-14 DIAGNOSIS — Z7901 Long term (current) use of anticoagulants: Secondary | ICD-10-CM

## 2013-07-14 LAB — POCT INR: INR: 2.7

## 2013-08-11 ENCOUNTER — Ambulatory Visit (INDEPENDENT_AMBULATORY_CARE_PROVIDER_SITE_OTHER): Payer: Medicare Other | Admitting: *Deleted

## 2013-08-11 DIAGNOSIS — Z7901 Long term (current) use of anticoagulants: Secondary | ICD-10-CM

## 2013-08-11 DIAGNOSIS — I4891 Unspecified atrial fibrillation: Secondary | ICD-10-CM

## 2013-08-11 DIAGNOSIS — Z5181 Encounter for therapeutic drug level monitoring: Secondary | ICD-10-CM

## 2013-08-11 LAB — POCT INR: INR: 3.7

## 2013-08-20 ENCOUNTER — Other Ambulatory Visit: Payer: Self-pay | Admitting: Cardiology

## 2013-09-01 ENCOUNTER — Ambulatory Visit (INDEPENDENT_AMBULATORY_CARE_PROVIDER_SITE_OTHER): Payer: Medicare Other | Admitting: *Deleted

## 2013-09-01 DIAGNOSIS — Z5181 Encounter for therapeutic drug level monitoring: Secondary | ICD-10-CM

## 2013-09-01 DIAGNOSIS — I4891 Unspecified atrial fibrillation: Secondary | ICD-10-CM

## 2013-09-01 DIAGNOSIS — Z7901 Long term (current) use of anticoagulants: Secondary | ICD-10-CM

## 2013-09-01 LAB — POCT INR: INR: 4.6

## 2013-09-15 ENCOUNTER — Ambulatory Visit (INDEPENDENT_AMBULATORY_CARE_PROVIDER_SITE_OTHER): Payer: Medicare Other | Admitting: *Deleted

## 2013-09-15 DIAGNOSIS — Z5181 Encounter for therapeutic drug level monitoring: Secondary | ICD-10-CM

## 2013-09-15 DIAGNOSIS — Z7901 Long term (current) use of anticoagulants: Secondary | ICD-10-CM

## 2013-09-15 DIAGNOSIS — I4891 Unspecified atrial fibrillation: Secondary | ICD-10-CM

## 2013-09-15 LAB — POCT INR: INR: 2.4

## 2013-09-19 ENCOUNTER — Other Ambulatory Visit: Payer: Self-pay | Admitting: Adult Health

## 2013-10-06 ENCOUNTER — Ambulatory Visit (INDEPENDENT_AMBULATORY_CARE_PROVIDER_SITE_OTHER): Payer: Medicare Other | Admitting: *Deleted

## 2013-10-06 DIAGNOSIS — Z5181 Encounter for therapeutic drug level monitoring: Secondary | ICD-10-CM

## 2013-10-06 DIAGNOSIS — Z7901 Long term (current) use of anticoagulants: Secondary | ICD-10-CM

## 2013-10-06 DIAGNOSIS — I4891 Unspecified atrial fibrillation: Secondary | ICD-10-CM

## 2013-10-06 LAB — POCT INR: INR: 1.6

## 2013-10-23 ENCOUNTER — Ambulatory Visit (INDEPENDENT_AMBULATORY_CARE_PROVIDER_SITE_OTHER): Payer: Medicare Other | Admitting: *Deleted

## 2013-10-23 DIAGNOSIS — Z7901 Long term (current) use of anticoagulants: Secondary | ICD-10-CM

## 2013-10-23 DIAGNOSIS — Z5181 Encounter for therapeutic drug level monitoring: Secondary | ICD-10-CM

## 2013-10-23 DIAGNOSIS — I4891 Unspecified atrial fibrillation: Secondary | ICD-10-CM

## 2013-10-23 LAB — POCT INR: INR: 2

## 2013-11-04 ENCOUNTER — Encounter: Payer: Self-pay | Admitting: Cardiology

## 2013-11-04 ENCOUNTER — Ambulatory Visit: Payer: Self-pay | Admitting: *Deleted

## 2013-11-04 ENCOUNTER — Ambulatory Visit (INDEPENDENT_AMBULATORY_CARE_PROVIDER_SITE_OTHER): Payer: Medicare Other | Admitting: Cardiology

## 2013-11-04 VITALS — BP 106/56 | HR 83 | Ht 63.0 in | Wt 154.0 lb

## 2013-11-04 DIAGNOSIS — R9431 Abnormal electrocardiogram [ECG] [EKG]: Secondary | ICD-10-CM

## 2013-11-04 DIAGNOSIS — Z5181 Encounter for therapeutic drug level monitoring: Secondary | ICD-10-CM

## 2013-11-04 DIAGNOSIS — I4891 Unspecified atrial fibrillation: Secondary | ICD-10-CM

## 2013-11-04 MED ORDER — APIXABAN 5 MG PO TABS: 5.0000 mg | ORAL_TABLET | Freq: Two times a day (BID) | ORAL | Status: DC

## 2013-11-04 NOTE — Progress Notes (Signed)
Clinical Summary Ms. Garlick is a 80 y.o.female former patient of Dr Lattie Haw, this is our first visit together. She is seen for the following medical problems.   1. Afib - denies any palpitations. Lightheadedness or dizziness - denies any bleeding issueson  coumadin, followed here in coumadin clinic.  - patient with history of dementia, family denies any falls at home.  2. 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 Upper GI bleed     secondary to gastritis-2008; gastric polyps also noted.  . Diverticulosis   . Atrial fibrillation 2007    Normal echo and TSH in 2007  . Chronic anticoagulation   . Tobacco abuse, in remission     10-pack-years; remote  . Anxiety and depression   . Macular degeneration   . 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 Uterine cancer 2002    surgery, radiation therapy and chemotherapy  . Anxiety   . Panic attack      No Known Allergies   Current Outpatient Prescriptions  Medication Sig Dispense Refill  . furosemide (LASIX) 40 MG tablet Take 40 mg by mouth as needed.      Marland Kitchen HYDROcodone-acetaminophen (NORCO/VICODIN) 5-325 MG per tablet Take 1 tablet by mouth every 8 (eight) hours as needed for pain.      Marland Kitchen LORazepam (ATIVAN) 0.5 MG tablet Take 0.5 mg by mouth every 8 (eight) hours as needed for anxiety.      . meloxicam (MOBIC) 15 MG tablet Take 15 mg by mouth daily.      . metoprolol succinate (TOPROL-XL) 50 MG 24 hr tablet TAKE 1 TABLET BY MOUTH ONCE DAILY, WITH OR IMMEDIATELY AFTER A MEAL.  30 tablet  6  . sertraline (ZOLOFT) 100 MG tablet Take 100 mg by mouth daily.      . traZODone (DESYREL) 50 MG tablet Take 50 mg by mouth at bedtime.      Marland Kitchen warfarin (COUMADIN) 2.5 MG tablet Take 2.5 mg by mouth as directed.       No current facility-administered  medications for this visit.     Past Surgical History  Procedure Laterality Date  . Appendectomy  2002  . Cholecystectomy  1990's  . Total abdominal hysterectomy w/ bilateral salpingoophorectomy  2002    + lymph node dissection; 4 uterine carcinoma  . Colonoscopy  2005; 08/2005    2005-polypectomy with microscopic foci of carcinoma; 2007-no recurrence of polyps     No Known Allergies    Family History  Problem Relation Age of Onset  . Prostate cancer Father   . Hypothyroidism Mother      Social History Ms. Fabrizio reports that she quit smoking about 45 years ago. Her smoking use included Cigarettes. She has a 10 pack-year smoking history. She has never used smokeless tobacco. Ms. Minarik reports that she does not drink alcohol.   Review of Systems CONSTITUTIONAL: No weight loss, fever, chills, weakness or fatigue.  HEENT: Eyes: No visual loss, blurred vision, double vision or yellow sclerae.No hearing loss, sneezing, congestion, runny nose or sore throat.  SKIN: No rash or itching.  CARDIOVASCULAR: per HPI RESPIRATORY: No shortness of breath, cough or sputum.  GASTROINTESTINAL: No anorexia, nausea, vomiting or diarrhea. No abdominal pain or blood.  GENITOURINARY: No burning on urination, no polyuria NEUROLOGICAL: No headache,  dizziness, syncope, paralysis, ataxia, numbness or tingling in the extremities. No change in bowel or bladder control.  MUSCULOSKELETAL: No muscle, back pain, joint pain or stiffness.  LYMPHATICS: No enlarged nodes. No history of splenectomy.  PSYCHIATRIC: No history of depression or anxiety.  ENDOCRINOLOGIC: No reports of sweating, cold or heat intolerance. No polyuria or polydipsia.  Marland Kitchen   Physical Examination p 83 bp 106/56 Wt 154 lbs BMI 27 Gen: resting comfortably, no acute distress HEENT: no scleral icterus, pupils equal round and reactive, no palptable cervical adenopathy,  CV: irreg, no m/r/g, no JVD Resp: Clear to auscultation  bilaterally GI: abdomen is soft, non-tender, non-distended, normal bowel sounds, no hepatosplenomegaly MSK: extremities are warm, no edema.  Skin: warm, no rash Neuro:  no focal deficits Psych: appropriate affect   Diagnostic Studies 08/2011 Echo Study Conclusions  - Left ventricle: The cavity size was normal.Borderline LVH. Systolic function was normal. The estimated ejection fraction was in the range of 55% to 60%. Wall motion was normal; there were no regional wall motion abnormalities. - Aortic valve: Mildly calcified annulus. Trileaflet; normal thickness, mildly calcified leaflets. - Mitral valve: Calcified annulus. - Left atrium: The atrium was mildly dilated. - Atrial septum: No defect or patent foramen ovale was identified. - Pulmonary arteries: PA peak pressure: 97mm Hg (S). Impressions:  - Compared to the prior study performed 03/10/10, there has been no significant interval change.   11/04/13 [facility]inc EKG Afib,rate 85   Assessment and Plan  1. Afib - rate controlled, patient denies any symptoms - currently on coumadin, family is interested in starting NOAC. Reports difficult to make coumadin appointments and also like the decreased bleeding seen with eliquis - will start eliquis 5mg  bid after having patient stop coumadin for 3 days.  2. LE edema - controlled with current lasix dosing, will conitnue   F/u 1 year   Arnoldo Lenis, M.D., F.A.C.C.

## 2013-11-04 NOTE — Patient Instructions (Signed)
Your physician wants you to follow-up in: 1 year You will receive a reminder letter in the mail two months in advance. If you don't receive a letter, please call our office to schedule the follow-up appointment.   Your physician has recommended you make the following change in your medication:    STOP Coumadin   START in 3 days Eliquis 5 mg twice a day    Thank you for choosing [facility] !

## 2013-12-03 ENCOUNTER — Other Ambulatory Visit: Payer: Self-pay | Admitting: Cardiology

## 2013-12-03 ENCOUNTER — Ambulatory Visit (INDEPENDENT_AMBULATORY_CARE_PROVIDER_SITE_OTHER): Payer: Medicare Other | Admitting: *Deleted

## 2013-12-03 DIAGNOSIS — Z7901 Long term (current) use of anticoagulants: Secondary | ICD-10-CM

## 2013-12-03 DIAGNOSIS — I4891 Unspecified atrial fibrillation: Secondary | ICD-10-CM

## 2013-12-03 LAB — BASIC METABOLIC PANEL
BUN: 30 mg/dL — ABNORMAL HIGH (ref 6–23)
CALCIUM: 9.3 mg/dL (ref 8.4–10.5)
CO2: 28 mEq/L (ref 19–32)
Chloride: 104 mEq/L (ref 96–112)
Creat: 1.66 mg/dL — ABNORMAL HIGH (ref 0.50–1.10)
GLUCOSE: 96 mg/dL (ref 70–99)
POTASSIUM: 4.5 meq/L (ref 3.5–5.3)
SODIUM: 142 meq/L (ref 135–145)

## 2013-12-03 LAB — CBC
HCT: 36.9 % (ref 36.0–46.0)
HEMOGLOBIN: 12.3 g/dL (ref 12.0–15.0)
MCH: 28.3 pg (ref 26.0–34.0)
MCHC: 33.3 g/dL (ref 30.0–36.0)
MCV: 84.8 fL (ref 78.0–100.0)
Platelets: 249 10*3/uL (ref 150–400)
RBC: 4.35 MIL/uL (ref 3.87–5.11)
RDW: 17.7 % — ABNORMAL HIGH (ref 11.5–15.5)
WBC: 6.9 10*3/uL (ref 4.0–10.5)

## 2013-12-04 ENCOUNTER — Other Ambulatory Visit: Payer: Self-pay

## 2013-12-04 ENCOUNTER — Telehealth: Payer: Self-pay

## 2013-12-04 DIAGNOSIS — I4891 Unspecified atrial fibrillation: Secondary | ICD-10-CM

## 2013-12-04 MED ORDER — APIXABAN 2.5 MG PO TABS: 2.5000 mg | ORAL_TABLET | Freq: Two times a day (BID) | ORAL | Status: DC

## 2013-12-04 NOTE — Telephone Encounter (Signed)
I have given daughter samples of eliquis 2.5 mg to take bid until we can assess insurance situation,i also gave daughter  lab slip to repeat bmet in 1 month

## 2013-12-04 NOTE — Telephone Encounter (Signed)
Message copied by Bernita Raisin on Thu Dec 04, 2013  1:17 PM ------      Message from: Laurine Blazer      Created: Thu Dec 04, 2013 12:07 PM       [facility] patient            ----- Message -----         From: Lendon Colonel, NP         Sent: 12/04/2013  10:27 AM           To: Laurine Blazer, LPN, Arnoldo Lenis, MD            Due to elevation in creatinine to 1.69 from previous documentation, age > 56, will decrease Eliquis to 2.5 mg BID from 5 mg BID. Wt is 154 lbs. Follow up BMET in 1 month. Start today.       ------

## 2013-12-05 ENCOUNTER — Telehealth: Payer: Self-pay | Admitting: Cardiology

## 2013-12-05 ENCOUNTER — Other Ambulatory Visit: Payer: Self-pay

## 2013-12-05 MED ORDER — APIXABAN 2.5 MG PO TABS: 2.5000 mg | ORAL_TABLET | Freq: Two times a day (BID) | ORAL | Status: DC

## 2013-12-05 NOTE — Telephone Encounter (Signed)
Established home delivery for eliquis 2.5 mg bid with [employer] mail order [phone] option 1 contact person [place].cost to pt is $16.000 for 90 day supply

## 2013-12-05 NOTE — Telephone Encounter (Signed)
Please see paper in refill bin / [facility]gs  °

## 2013-12-09 NOTE — Patient Instructions (Signed)
Pt was started on Eliquis 5mg  bid for atrial fib on 12/07/13.    Reviewed patients medication list.  Pt is not currently on any combined P-gp and strong CYP3A4 inhibitors/inducers (ketoconazole, traconazole, ritonavir, carbamazepine, phenytoin, rifampin, St. John's wort).  Reviewed labs.  SCr 1.66, Weight 154, CrCl- 27.82   Dose inappropriate based on CrCl.  Dose decrease to 2.5mg  bid.  Hgb and HCT 12.3/36.9  A full discussion of the nature of anticoagulants has been carried out.  A benefit/risk analysis has been presented to the patient, so that they understand the justification for choosing anticoagulation with Eliquis at this time.  The need for compliance is stressed.  Pt is aware to take the medication twice daily.  Side effects of potential bleeding are discussed, including unusual colored urine or stools, coughing up blood or coffee ground emesis, nose bleeds or serious fall or head trauma.  Discussed signs and symptoms of stroke. The patient should avoid any OTC items containing aspirin or ibuprofen.  Avoid alcohol consumption.   Call if any signs of abnormal bleeding.  Discussed financial obligations and resolved any difficulty in obtaining medication.  Next lab test test in 3 months.

## 2013-12-22 ENCOUNTER — Encounter: Payer: Self-pay | Admitting: Cardiology

## 2014-04-16 ENCOUNTER — Other Ambulatory Visit: Payer: Self-pay | Admitting: Cardiology

## 2014-04-27 ENCOUNTER — Telehealth: Payer: Self-pay | Admitting: Cardiology

## 2014-04-27 MED ORDER — APIXABAN 2.5 MG PO TABS: 2.5000 mg | ORAL_TABLET | Freq: Two times a day (BID) | ORAL | Status: DC

## 2014-04-27 NOTE — Telephone Encounter (Signed)
Needs refill on Eliquis sent to [facility] / [facility]gs

## 2014-05-05 ENCOUNTER — Telehealth: Payer: Self-pay | Admitting: *Deleted

## 2014-05-05 MED ORDER — APIXABAN 2.5 MG PO TABS: 2.5000 mg | ORAL_TABLET | Freq: Two times a day (BID) | ORAL | Status: DC

## 2014-05-05 NOTE — Telephone Encounter (Signed)
Pt daughter calling about refill request for eliquis. Coralyn Mark took a message on 04/27/14 for rx to be [facility] but it was refilled to [facility].  Pt is almost out of medication and can not afford to pick it up from [facility].   Please call in to correct pharmacy and see if we have samples for patient.  Pt daughter is expecting a call back today.

## 2014-05-05 NOTE — Telephone Encounter (Signed)
eliquis 2.5 mg sent to [facility]. Samples ready for pickup. Pt daughter made aware and pt daughter clarified that pt mistakenly told us [facility] on 10/19.

## 2014-05-18 ENCOUNTER — Other Ambulatory Visit: Payer: Self-pay | Admitting: Cardiology

## 2014-08-19 ENCOUNTER — Encounter: Payer: Self-pay | Admitting: Cardiology

## 2014-12-16 ENCOUNTER — Other Ambulatory Visit: Payer: Self-pay | Admitting: Cardiology

## 2015-02-24 IMAGING — CR DG LUMBAR SPINE COMPLETE 4+V
5 series · 5 of 5 positions shown · non-contrast
Comparison: None.

CLINICAL DATA: Low back pain

LUMBAR SPINE - COMPLETE 4+ VIEW

[view not recorded (1 of 5)]
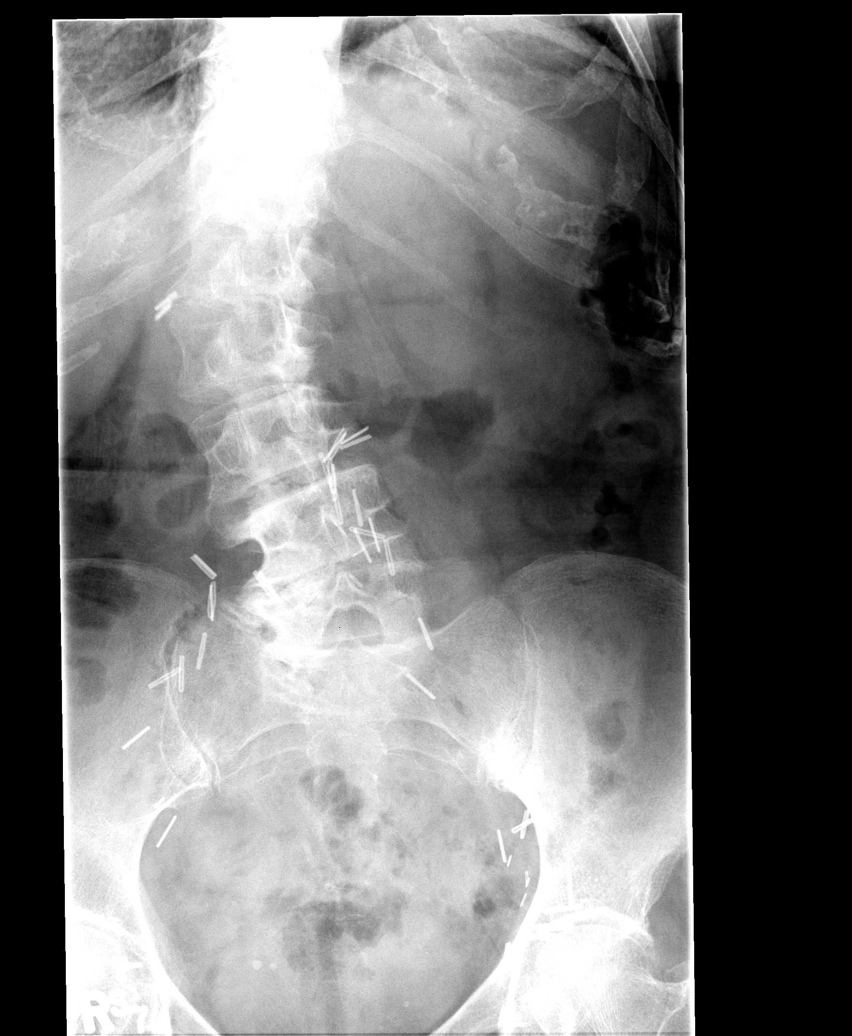

[view not recorded (2 of 5)]
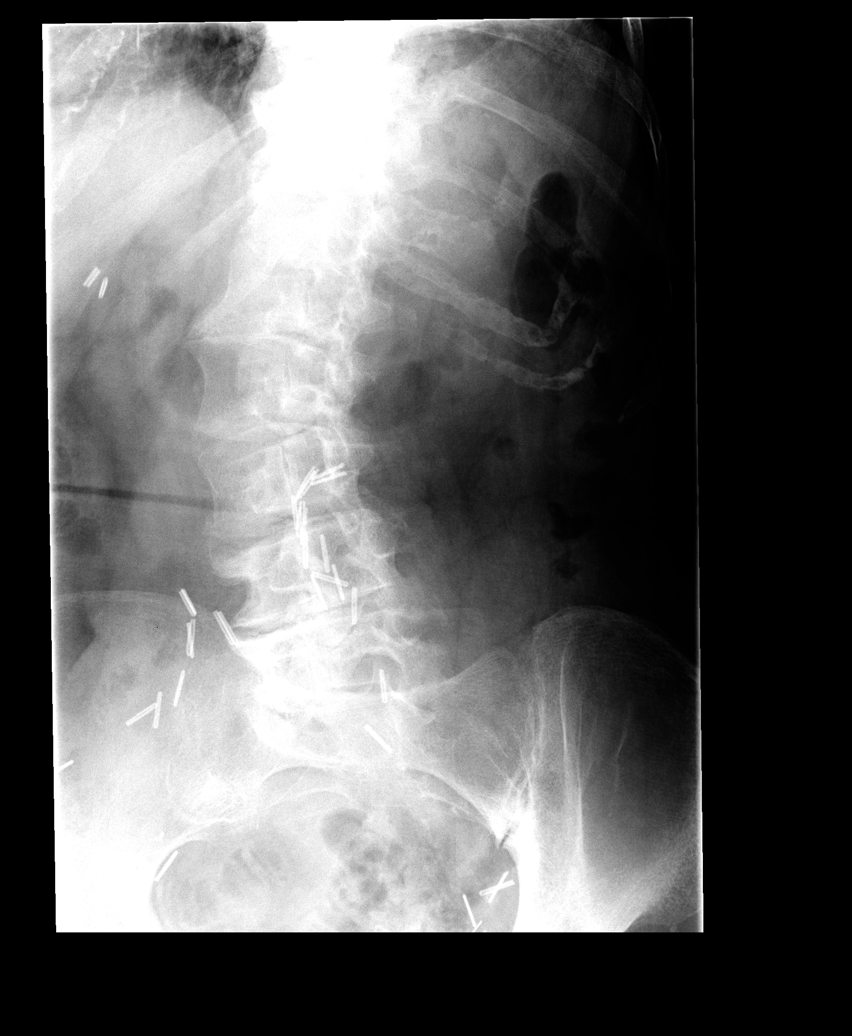

[view not recorded (3 of 5)]
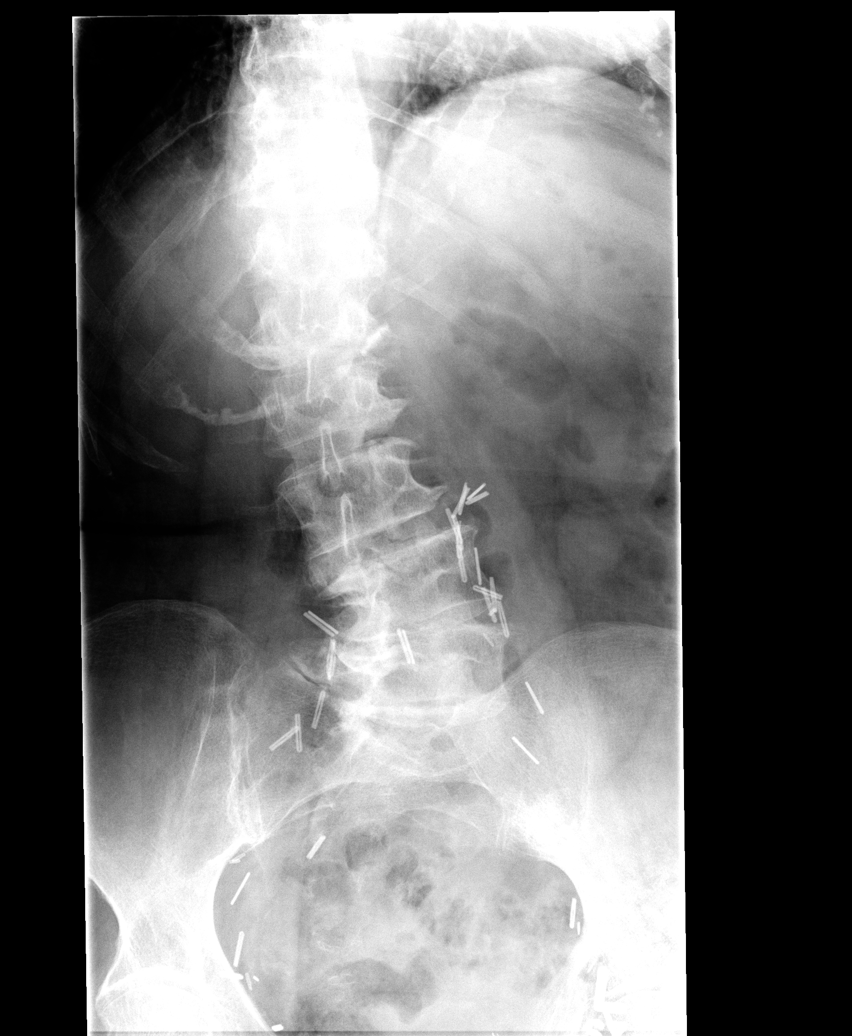

[view not recorded (4 of 5)]
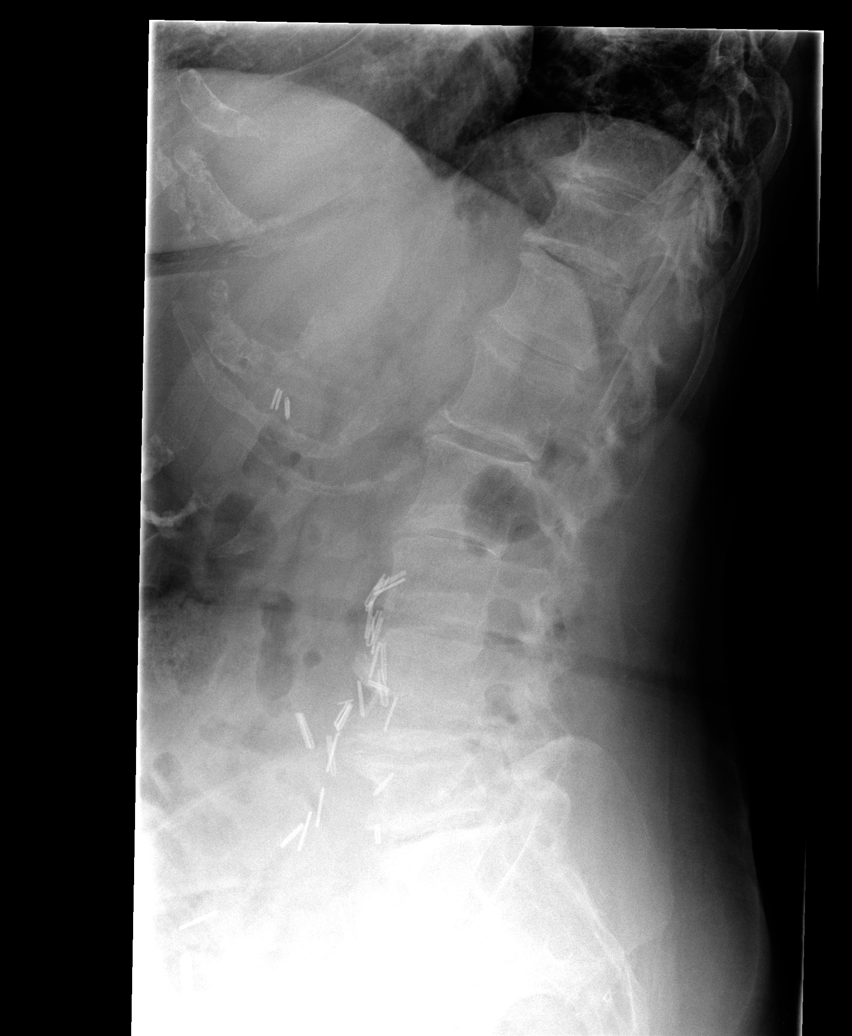

[view not recorded (5 of 5)]
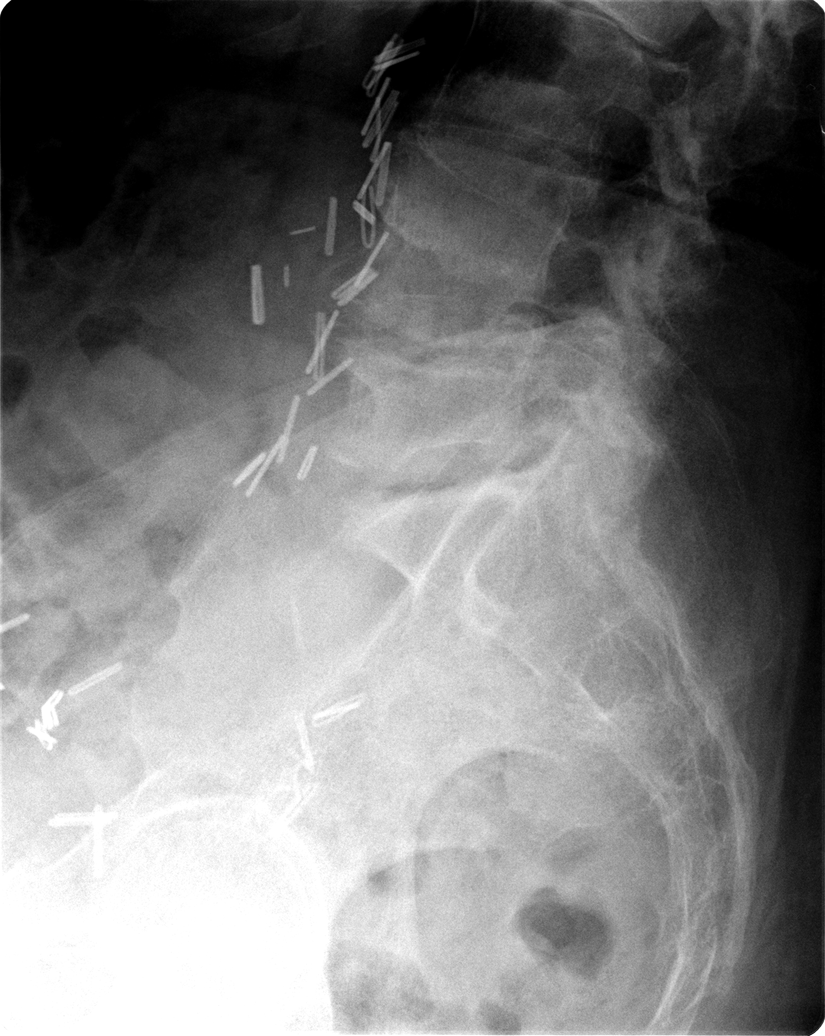

[5 of 5 positions shown; findings below may reference images not displayed]

FINDINGS: Frontal, lateral, spot lumbosacral lateral, and
bilateral oblique views were obtained.  There are five non-rib
bearing lumbar type vertebral bodies.  There is thoracolumbar
dextrorotoscoliosis.  There is no fracture or spondylolisthesis.

There is marked disc narrowing at all levels.  There is facet
osteoarthritic change at all levels bilaterally.  There are
multiple surgical clips in the retroperitoneum. There is no erosive
change or bony destruction.
IMPRESSION: Extensive arthropathy and scoliosis.  No fracture or
spondylolisthesis.

## 2015-03-03 ENCOUNTER — Other Ambulatory Visit: Payer: Self-pay | Admitting: Cardiology

## 2015-03-09 ENCOUNTER — Telehealth: Payer: Self-pay | Admitting: Cardiology

## 2015-03-09 NOTE — Telephone Encounter (Signed)
Patient was told by pharmacy that they would need to see doctor before filling RX. Romie Minus stated her mother would probably not be able to come to doctor due to her condition

## 2015-03-09 NOTE — Telephone Encounter (Signed)
Patient last seen 11/04/13. Would you like to continue to fill meds. Please advise.

## 2015-03-10 MED ORDER — METOPROLOL SUCCINATE ER 50 MG PO TB24: ORAL_TABLET | ORAL | Status: AC

## 2015-03-10 MED ORDER — FUROSEMIDE 40 MG PO TABS: 40.0000 mg | ORAL_TABLET | ORAL | Status: AC | PRN

## 2015-03-10 MED ORDER — APIXABAN 2.5 MG PO TABS: 2.5000 mg | ORAL_TABLET | Freq: Two times a day (BID) | ORAL | Status: DC

## 2015-03-10 NOTE — Addendum Note (Signed)
Addended by: Levonne Hubert on: 03/10/2015 05:02 PM   Modules accepted: Orders

## 2015-03-10 NOTE — Telephone Encounter (Signed)
We can refill her heart meds but she needs to be seen, especially since she is on a blood thinner. What is the specific limitation on getting her into the office?   Zandra Abts MD

## 2015-03-11 NOTE — Telephone Encounter (Signed)
Spoke to PT's daughter, explained Dr. Nelly Laurence message to her. She would like to D/C eliquis and start on ASA. I faxed [facility] a cancellation notice as the Rx was sent in on Aug 31st. I told daughter to call back if she has any further questions.

## 2015-03-11 NOTE — Telephone Encounter (Signed)
Ok, I'm sorry for the troubles she is having health wise. Given her dementia and not using her walker I am concerned about a fall risk for her. If she fell on blood thinner there is a higher risk of significant bleeding, including bleeding in the brain which can be very dangerous. I would suggest given all that is going on that we consider stopping her eliquis and use aspirin instead. This would crease a slightly higher risk of stroke but would lower her bleeding risk. See if family agrees.   Zandra Abts MD

## 2015-03-11 NOTE — Addendum Note (Signed)
Addended byDebbora Lacrosse R on: 03/11/2015 12:21 PM   Modules accepted: Orders, Medications

## 2015-03-11 NOTE — Telephone Encounter (Signed)
Spoke with daughter and she states that her mother has dementia and is unable to leave the home. Daughter states that her mother has a walker that she refuses to use. The patient has also become very combative with caregivers making it difficult to transport her out of the home. The daughter states that she does not want to place her mother in a nursing home. She also reports the PCP is currently trying the patient on antipsychotics that are having an opposite effect on her. The daughter voiced understanding that if she just stopped giving her mother her medications and " let nature take it's course" that this would be considered a form of neglect.

## 2015-08-11 DEATH — deceased
